# Patient Record
Sex: Male | Born: 1995
Health system: Southern US, Community
[De-identification: ages and names within clinical notes are randomized; demographics above are authoritative.]

## PROBLEM LIST (undated history)

## (undated) DIAGNOSIS — F419 Anxiety disorder, unspecified: Secondary | ICD-10-CM

## (undated) DIAGNOSIS — T7840XA Allergy, unspecified, initial encounter: Secondary | ICD-10-CM

## (undated) DIAGNOSIS — J45909 Unspecified asthma, uncomplicated: Secondary | ICD-10-CM

## (undated) HISTORY — DX: Allergy, unspecified, initial encounter: T78.40XA

## (undated) HISTORY — PX: APPENDECTOMY: SHX54

## (undated) HISTORY — PX: HERNIA REPAIR: SHX51

## (undated) HISTORY — DX: Anxiety disorder, unspecified: F41.9

---
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
## 2015-09-27 ENCOUNTER — Emergency Department
Admission: EM | Admit: 2015-09-27 | Discharge: 2015-09-27 | Disposition: A | Discharge status: Home / Self Care | Payer: Worker's Compensation | Attending: Emergency Medicine | Admitting: Emergency Medicine

## 2015-09-27 ENCOUNTER — Emergency Department: Payer: Worker's Compensation

## 2015-09-27 ENCOUNTER — Encounter: Payer: Self-pay | Admitting: *Deleted

## 2015-09-27 DIAGNOSIS — W231XXA Caught, crushed, jammed, or pinched between stationary objects, initial encounter: Secondary | ICD-10-CM | POA: Diagnosis not present

## 2015-09-27 DIAGNOSIS — Y9389 Activity, other specified: Secondary | ICD-10-CM | POA: Diagnosis not present

## 2015-09-27 DIAGNOSIS — S8001XA Contusion of right knee, initial encounter: Secondary | ICD-10-CM | POA: Insufficient documentation

## 2015-09-27 DIAGNOSIS — S99911A Unspecified injury of right ankle, initial encounter: Secondary | ICD-10-CM | POA: Diagnosis present

## 2015-09-27 DIAGNOSIS — Y9289 Other specified places as the place of occurrence of the external cause: Secondary | ICD-10-CM | POA: Diagnosis not present

## 2015-09-27 DIAGNOSIS — Y990 Civilian activity done for income or pay: Secondary | ICD-10-CM | POA: Insufficient documentation

## 2015-09-27 DIAGNOSIS — Z79899 Other long term (current) drug therapy: Secondary | ICD-10-CM | POA: Insufficient documentation

## 2015-09-27 DIAGNOSIS — S93401A Sprain of unspecified ligament of right ankle, initial encounter: Secondary | ICD-10-CM | POA: Insufficient documentation

## 2015-09-27 NOTE — Discharge Instructions (Signed)
Ankle Sprain °An ankle sprain is an injury to the strong, fibrous tissues (ligaments) that hold the bones of your ankle joint together.  °CAUSES °An ankle sprain is usually caused by a fall or by twisting your ankle. Ankle sprains most commonly occur when you step on the outer edge of your foot, and your ankle turns inward. People who participate in sports are more prone to these types of injuries.  °SYMPTOMS  °· Pain in your ankle. The pain may be present at rest or only when you are trying to stand or walk. °· Swelling. °· Bruising. Bruising may develop immediately or within 1 to 2 days after your injury. °· Difficulty standing or walking, particularly when turning corners or changing directions. °DIAGNOSIS  °Your caregiver will ask you details about your injury and perform a physical exam of your ankle to determine if you have an ankle sprain. During the physical exam, your caregiver will press on and apply pressure to specific areas of your foot and ankle. Your caregiver will try to move your ankle in certain ways. An X-ray exam may be done to be sure a bone was not broken or a ligament did not separate from one of the bones in your ankle (avulsion fracture).  °TREATMENT  °Certain types of braces can help stabilize your ankle. Your caregiver can make a recommendation for this. Your caregiver may recommend the use of medicine for pain. If your sprain is severe, your caregiver may refer you to a surgeon who helps to restore function to parts of your skeletal system (orthopedist) or a physical therapist. °HOME CARE INSTRUCTIONS  °· Apply ice to your injury for 1-2 days or as directed by your caregiver. Applying ice helps to reduce inflammation and pain. °¨ Put ice in a plastic bag. °¨ Place a towel between your skin and the bag. °¨ Leave the ice on for 15-20 minutes at a time, every 2 hours while you are awake. °· Only take over-the-counter or prescription medicines for pain, discomfort, or fever as directed by  your caregiver. °· Elevate your injured ankle above the level of your heart as much as possible for 2-3 days. °· If your caregiver recommends crutches, use them as instructed. Gradually put weight on the affected ankle. Continue to use crutches or a cane until you can walk without feeling pain in your ankle. °· If you have a plaster splint, wear the splint as directed by your caregiver. Do not rest it on anything harder than a pillow for the first 24 hours. Do not put weight on it. Do not get it wet. You may take it off to take a shower or bath. °· You may have been given an elastic bandage to wear around your ankle to provide support. If the elastic bandage is too tight (you have numbness or tingling in your foot or your foot becomes cold and blue), adjust the bandage to make it comfortable. °· If you have an air splint, you may blow more air into it or let air out to make it more comfortable. You may take your splint off at night and before taking a shower or bath. Wiggle your toes in the splint several times per day to decrease swelling. °SEEK MEDICAL CARE IF:  °· You have rapidly increasing bruising or swelling. °· Your toes feel extremely cold or you lose feeling in your foot. °· Your pain is not relieved with medicine. °SEEK IMMEDIATE MEDICAL CARE IF: °· Your toes are numb or blue. °·   You have severe pain that is increasing. MAKE SURE YOU:   Understand these instructions.  Will watch your condition.  Will get help right away if you are not doing well or get worse.   This information is not intended to replace advice given to you by your health care provider. Make sure you discuss any questions you have with your health care provider.   Document Released: 10/11/2005 Document Revised: 11/01/2014 Document Reviewed: 10/23/2011 Elsevier Interactive Patient Education [employer].  Please return immediately if condition worsens. Please contact her primary physician or the physician you were given  for referral. If you have any specialist physicians involved in her treatment and plan please also contact them. Thank you for using [facility].

## 2015-09-27 NOTE — ED Notes (Signed)
Called primary contact listed on [facility] profile, message left regarding pt injury. UDS, Breath analysis, and BAC are all upon request. No one answered contact phone listed as primary and we were not advised if testing was indicated at this time.

## 2015-09-27 NOTE — ED Notes (Signed)
Pt states foot was caught in product wrapping on a palette. Pt states he fell on R knee and twisted R ankle. Pt presents using crutch ambulation, c/o painful weight bearing on R leg.

## 2015-09-28 NOTE — ED Provider Notes (Signed)
Time Seen: Approximately 0 300  I have reviewed the triage notes  Chief Complaint: Foot Injury   History of Present Illness: Philip Stephenson is a 19 y.o. male who was at work tonight when he slipped and twisted his right ankle and struck his right knee. Patient states he was caught on a pallet and then fell to his right knee and twisted his ankle. Patient denies any head trauma and has been able to bear some weight on his right foot..   Past Medical History  Diagnosis Date  . Arthritis     There are no active problems to display for this patient.   Past Surgical History  Procedure Laterality Date  . Appendectomy    . Hernia repair      Past Surgical History  Procedure Laterality Date  . Appendectomy    . Hernia repair      Current Outpatient Rx  Name  Route  Sig  Dispense  Refill  . albuterol (PROVENTIL HFA;VENTOLIN HFA) 108 (90 BASE) MCG/ACT inhaler   Inhalation   Inhale into the lungs every 6 (six) hours as needed for wheezing or shortness of breath.         . cetirizine (ZYRTEC) 10 MG tablet   Oral   Take 10 mg by mouth daily.         . montelukast (SINGULAIR) 10 MG tablet   Oral   Take 10 mg by mouth at bedtime.           Allergies:  Review of patient's allergies indicates no known allergies.  Family History: History reviewed. No pertinent family history.  Social History: Social History  Substance Use Topics  . Smoking status: Never Smoker   . Smokeless tobacco: Never Used  . Alcohol Use: Yes     Comment: rarely     Review of Systems:   Patient denies any head trauma he denies any other extremity discomfort other than the right knee and his right ankle. He denies any previous injury. Physical Exam:  ED Triage Vitals  Enc Vitals Group     BP 09/27/15 0207 143/72 mmHg     Pulse Rate 09/27/15 0207 111     Resp 09/27/15 0207 18     Temp 09/27/15 0207 98.2 F (36.8 C)     Temp Source 09/27/15 0207 Oral     SpO2 09/27/15 0207 100 %      Weight 09/27/15 0207 232 lb (105.235 kg)     Height 09/27/15 0207 5\' 7"  (1.702 m)     Head Cir --      Peak Flow --      Pain Score 09/27/15 0208 7     Pain Loc --      Pain Edu? --      Excl. in [place]? --     General: Awake , Alert , and Oriented times 3; GCS 15 Head: Normal cephalic , atraumatic Lungs: Clear to ascultation without wheezes , rhonchi, or rales Heart: Regular rate, regular rhythm without murmurs , gallops , or rubs    Extremities: Patient's knee is stable to palpation with no external contusions, abrasion or obvious joint effusion. Negative Lockman. Negative anterior posterior drawer test. The right ankle appears to have ecchymosis on the lateral surface of the ankle is tender the Achilles tendon is intact Neurologic: normal ambulation, Motor symmetric without deficits, sensory intact Skin: warm, dry, no rashes    Radiology: *   CLINICAL DATA: Pain after twisting injury.  EXAM: RIGHT ANKLE - COMPLETE 3+ VIEW  COMPARISON: None.  FINDINGS: There is no evidence of fracture, dislocation, or joint effusion. There is no evidence of arthropathy or other focal bone abnormality. Soft tissues are unremarkable.  IMPRESSION: Negative.   Electronically Signed By: Andreas Newport M.D. On: 09/27/2015 03:07          DG Knee Complete 4 Views Right (Final result) Result time: 09/27/15 [id number]   Final result by Rad Results In Interface (09/27/15 [id number])   Narrative:   CLINICAL DATA: Pain after fall and twisting injury  EXAM: RIGHT KNEE - COMPLETE 4+ VIEW  COMPARISON: None.  FINDINGS: There is no evidence of fracture, dislocation, or joint effusion. There is no evidence of arthropathy or other focal bone abnormality. Soft tissues are unremarkable.  IMPRESSION: Negative.       I personally reviewed the radiologic studies   Procedures:  We attempted to establish a right ankle brace but recurrently out of the Velcro braces so we  placed a Ace wrap and the patient may be able to acquire an ankle brace with his orthopedic follow-up    ED Course:  Patient's stay here was uneventful and he or he has apparent crutches. I felt his knee would be fine as likely only a mild contusion. He appears to have a grade 1 ankle sprain. He does not have any tenderness over his foot and his x-rays did not show any other fractures or lesions.   Assessment:  Right ankle sprain Right knee contusion  Final Clinical Impression:  Final diagnoses:  Right ankle sprain, initial encounter     Plan:  Outpatient management Patient was advised to return immediately if condition worsens. Patient was advised to follow up with her primary care physician or other specialized physicians involved in their outpatient care             Daymon Larsen, MD 09/28/15 0157

## 2016-08-30 ENCOUNTER — Emergency Department
Admission: EM | Admit: 2016-08-30 | Discharge: 2016-08-30 | Disposition: A | Discharge status: Home / Self Care | Payer: Worker's Compensation | Attending: Emergency Medicine | Admitting: Emergency Medicine

## 2016-08-30 DIAGNOSIS — J45909 Unspecified asthma, uncomplicated: Secondary | ICD-10-CM | POA: Diagnosis not present

## 2016-08-30 DIAGNOSIS — Y990 Civilian activity done for income or pay: Secondary | ICD-10-CM | POA: Insufficient documentation

## 2016-08-30 DIAGNOSIS — Y929 Unspecified place or not applicable: Secondary | ICD-10-CM | POA: Insufficient documentation

## 2016-08-30 DIAGNOSIS — Z23 Encounter for immunization: Secondary | ICD-10-CM | POA: Diagnosis not present

## 2016-08-30 DIAGNOSIS — W268XXA Contact with other sharp object(s), not elsewhere classified, initial encounter: Secondary | ICD-10-CM | POA: Diagnosis not present

## 2016-08-30 DIAGNOSIS — Y9389 Activity, other specified: Secondary | ICD-10-CM | POA: Diagnosis not present

## 2016-08-30 DIAGNOSIS — Z79899 Other long term (current) drug therapy: Secondary | ICD-10-CM | POA: Insufficient documentation

## 2016-08-30 DIAGNOSIS — S61012A Laceration without foreign body of left thumb without damage to nail, initial encounter: Secondary | ICD-10-CM | POA: Diagnosis not present

## 2016-08-30 HISTORY — DX: Unspecified asthma, uncomplicated: J45.909

## 2016-08-30 MED ORDER — TETANUS-DIPHTH-ACELL PERTUSSIS 5-2.5-18.5 LF-MCG/0.5 IM SUSP
0.5000 mL | Freq: Once | INTRAMUSCULAR | Status: AC
  Administered 2016-08-30: 0.5 mL via INTRAMUSCULAR
  Filled 2016-08-30: qty 0.5

## 2016-08-30 NOTE — ED Notes (Addendum)
Pt was at work and cut left thumb - pt reports he dropped a piece of sheet metal and it bounced up and lacerated thumb - bleed is controlled at this time - pt did drug screen at work for this workers comp claim

## 2016-08-30 NOTE — ED Provider Notes (Signed)
[facility] Emergency Department Provider Note  ____________________________________________  Time seen: Approximately 10:24 PM  I have reviewed the triage vital signs and the nursing notes.   HISTORY  Chief Complaint Laceration    HPI Philip Stephenson is a 20 y.o. male who presents emergency department for laceration to the left thumb. Patient states this occurred at work. He states that he was working with a piece of sheet metal when he dropped it and it bounced up and caught his thumb. Patient denies any pain at this time. He states when he was easily controlled. He denies any loss of range of motion to his thumb. Patient is unsure of his last tetanus shot. No other injury or complaint. No medications prior to arrival.   Past Medical History:  Diagnosis Date  . Asthma     There are no active problems to display for this patient.   Past Surgical History:  Procedure Laterality Date  . APPENDECTOMY    . HERNIA REPAIR      Prior to Admission medications   Medication Sig Start Date End Date Taking? Authorizing Provider  albuterol (PROVENTIL HFA;VENTOLIN HFA) 108 (90 BASE) MCG/ACT inhaler Inhale into the lungs every 6 (six) hours as needed for wheezing or shortness of breath.    Historical Provider, MD  cetirizine (ZYRTEC) 10 MG tablet Take 10 mg by mouth daily.    Historical Provider, MD  montelukast (SINGULAIR) 10 MG tablet Take 10 mg by mouth at bedtime.    Historical Provider, MD    Allergies Patient has no known allergies.  No family history on file.  Social History Social History  Substance Use Topics  . Smoking status: Never Smoker  . Smokeless tobacco: Never Used  . Alcohol use Yes     Comment: rarely     Review of Systems  Constitutional: No fever/chills Cardiovascular: no chest pain. Respiratory: no cough. No SOB. Gastrointestinal: No abdominal pain.  No nausea, no vomiting.  Musculoskeletal: Negative for musculoskeletal  pain. Skin: Positive for laceration to the left thumb Neurological: Negative for headaches, focal weakness or numbness. 10-point ROS otherwise negative.  ____________________________________________   PHYSICAL EXAM:  VITAL SIGNS: ED Triage Vitals  Enc Vitals Group     BP 08/30/16 2147 (!) 159/64     Pulse Rate 08/30/16 2147 94     Resp 08/30/16 2147 18     Temp 08/30/16 2147 98.6 F (37 C)     Temp Source 08/30/16 2147 Oral     SpO2 08/30/16 2147 99 %     Weight 08/30/16 2147 215 lb (97.5 kg)     Height 08/30/16 2147 5' 7.5" (1.715 m)     Head Circumference --      Peak Flow --      Pain Score 08/30/16 2155 5     Pain Loc --      Pain Edu? --      Excl. in [place]? --      Constitutional: Alert and oriented. Well appearing and in no acute distress. Eyes: Conjunctivae are normal. PERRL. EOMI. Head: Atraumatic. Cardiovascular: Normal rate, regular rhythm. Normal S1 and S2.  Good peripheral circulation. Respiratory: Normal respiratory effort without tachypnea or retractions. Lungs CTAB. Good air entry to the bases with no decreased or absent breath sounds. Musculoskeletal: Full range of motion to all extremities. No gross deformities appreciated. Neurologic:  Normal speech and language. No gross focal neurologic deficits are appreciated.  Skin:  Skin is warm, dry and intact.  No rash noted.2 small superficial lacerations noted to the dorsal aspect of the thumb. No bleeding. No foreign body. Edges are smooth and nature. Lacerations are superficial. Both lacerations are less than 1 cm in length. Sensation Refill intact distally. Psychiatric: Mood and affect are normal. Speech and behavior are normal. Patient exhibits appropriate insight and judgement.   ____________________________________________   LABS (all labs ordered are listed, but only abnormal results are displayed)  Labs Reviewed - No data to  display ____________________________________________  EKG   ____________________________________________  RADIOLOGY   No results found.  ____________________________________________    PROCEDURES  Procedure(s) performed:    Marland KitchenMarland KitchenLaceration Repair Date/Time: 08/31/2016 12:14 AM Performed by: Betha Loa D Authorized by: Betha Loa D   Consent:    Consent obtained:  Verbal   Consent given by:  Patient   Risks discussed:  Infection   Alternatives discussed:  No treatment Anesthesia (see MAR for exact dosages):    Anesthesia method:  None Laceration details:    Location:  Finger   Finger location:  L thumb   Length (cm):  1 Repair type:    Repair type:  Simple Exploration:    Wound exploration: entire depth of wound probed and visualized     Wound extent: no foreign bodies/material noted, no muscle damage noted, no nerve damage noted, no tendon damage noted and no underlying fracture noted     Contaminated: no   Treatment:    Area cleansed with:  Shur-Clens   Amount of cleaning:  Standard   Irrigation solution:  Sterile saline   Irrigation method:  Syringe Skin repair:    Repair method:  Tissue adhesive Approximation:    Approximation:  Close Post-procedure details:    Dressing:  Open (no dressing)   Patient tolerance of procedure:  Tolerated well, no immediate complications      Medications  Tdap (BOOSTRIX) injection 0.5 mL (0.5 mLs Intramuscular Given 08/30/16 2305)     ____________________________________________   INITIAL IMPRESSION / ASSESSMENT AND PLAN / ED COURSE  Pertinent labs & imaging results that were available during my care of the patient were reviewed by me and considered in my medical decision making (see chart for details).  Review of the [place] CSRS was performed in accordance of the [place] prior to dispensing any controlled drugs.  Clinical Course     Patient's diagnosis is consistent with 2 superficial lacerations to  the left thumb. These are covered with Dermabond. Patient is given tdap at this time. No medications are prescribed.. Patient will follow-up with primary care as needed. Patient is given ED precautions to return to the ED for any worsening or new symptoms.     ____________________________________________  FINAL CLINICAL IMPRESSION(S) / ED DIAGNOSES  Final diagnoses:  Laceration of left thumb without foreign body without damage to nail, initial encounter      NEW MEDICATIONS STARTED DURING THIS VISIT:  Discharge Medication List as of 08/30/2016 10:53 PM          This chart was dictated using voice recognition software/Dragon. Despite best efforts to proofread, errors can occur which can change the meaning. Any change was purely unintentional.    Darletta Moll, PA-C 08/31/16 0016    Lisa Roca, MD 09/02/16 1026

## 2016-08-30 NOTE — ED Notes (Signed)
Patient stated he had drug screen testing completed at [place] prior to them filling the paperwork out. Patient presented EDT with verification of drug screen paperwork

## 2016-08-30 NOTE — ED Triage Notes (Signed)
Pt presents to ED with c/o LEFT thumb laceration that happened today at work. Pt reports injury happened around 820 pm when he cut himself with some sheet metal. Pt wished to file [facility], pt presents with paperwork from work where UDS was completed PTA. Pt presents with laceration covered with a band-aid with no bleeding at this time.

## 2017-02-08 DIAGNOSIS — L501 Idiopathic urticaria: Secondary | ICD-10-CM | POA: Diagnosis not present

## 2017-02-08 DIAGNOSIS — J4530 Mild persistent asthma, uncomplicated: Secondary | ICD-10-CM | POA: Diagnosis not present

## 2017-02-08 DIAGNOSIS — J301 Allergic rhinitis due to pollen: Secondary | ICD-10-CM | POA: Diagnosis not present

## 2017-03-24 ENCOUNTER — Ambulatory Visit: Payer: Self-pay | Admitting: Nurse Practitioner

## 2017-03-31 DIAGNOSIS — L219 Seborrheic dermatitis, unspecified: Secondary | ICD-10-CM | POA: Diagnosis not present

## 2017-04-05 ENCOUNTER — Ambulatory Visit (INDEPENDENT_AMBULATORY_CARE_PROVIDER_SITE_OTHER): Payer: Self-pay | Admitting: Nurse Practitioner

## 2017-04-05 ENCOUNTER — Encounter: Payer: Self-pay | Admitting: Nurse Practitioner

## 2017-04-05 VITALS — BP 113/60 | HR 59 | Temp 97.9°F | Ht 67.5 in | Wt 212.8 lb

## 2017-04-05 DIAGNOSIS — Z7689 Persons encountering health services in other specified circumstances: Secondary | ICD-10-CM | POA: Diagnosis not present

## 2017-04-05 DIAGNOSIS — Z0000 Encounter for general adult medical examination without abnormal findings: Secondary | ICD-10-CM

## 2017-04-05 DIAGNOSIS — E6609 Other obesity due to excess calories: Secondary | ICD-10-CM | POA: Insufficient documentation

## 2017-04-05 DIAGNOSIS — Z114 Encounter for screening for human immunodeficiency virus [HIV]: Secondary | ICD-10-CM

## 2017-04-05 DIAGNOSIS — Z6835 Body mass index (BMI) 35.0-35.9, adult: Secondary | ICD-10-CM

## 2017-04-05 NOTE — Patient Instructions (Addendum)
Mostyn, Thank you for coming in to clinic today.  1. You will be due for FASTING BLOOD WORK (no food or drink after midnight before, only water or coffee without cream/sugar on the morning of)  - Get drawn at any [facility] location.  They will come to me automatically.  For Lab Results, once available within 2-3 days of blood draw, you can can log in to MyChart online to view your results and a brief explanation. Also, we can discuss results at next follow-up visit.  2. Continue working on your weight loss! Great work!  Please schedule a follow-up appointment with Cassell Smiles, AGNP to Return in about 1 year (around 04/05/2018) for annual physical.  If you have any other questions or concerns, please feel free to call the clinic or send a message through [facility]. You may also schedule an earlier appointment if necessary.  Cassell Smiles, DNP, AGNP-BC Adult Gerontology Nurse Practitioner [place]

## 2017-04-05 NOTE — Progress Notes (Signed)
I have reviewed this encounter including the documentation in this note and/or discussed this patient with the provider, Cassell Smiles, AGPCNP-BC. I am certifying that I agree with the content of this note as supervising physician.  Nobie Putnam, [facility] 04/05/2017, 12:28 PM

## 2017-04-05 NOTE — Progress Notes (Signed)
Subjective:    Patient ID: Philip Stephenson, male    DOB: Jul 30, 1996, 21 y.o.   MRN: [id number]  Philip Stephenson is a 21 y.o. male presenting on 04/05/2017 for [place] Provider and Annual Physical Pt last seen by PCP in April 2015 years ago.  Obtain records from Care everywhere for [facility].   Patient has been feeling well.  They have no acute concerns today. He has an allergist for asthma and is in good control.  Uses albuterol 1-2 x per month.  Sleeps 7-10 hours per night uninterrupted.  Health Maintenance Weight/BMI: High at 32.8.  On weight watchers for last 2 months; down 90 lbs in 1.5 years. 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  Walking outside of work about 2 d/ week. Diet: Weight Watchers Seatbelt: Always Sunscreen: w/ longer exposures Tetanus: 2017, but no record in [facility] or Care Everywhere Gardasil: Series of 2 given 02/20/2014 and 04/22/2014 w/o follow-up for 3rd dose.   Past Medical History:  Diagnosis Date  . Allergy   . Asthma    Past Surgical History:  Procedure Laterality Date  . APPENDECTOMY    . HERNIA REPAIR     Social History   Social History  . Marital status: Single    Spouse name: N/A  . Number of children: N/A  . Years of education: N/A   Occupational History  . Not on file.   Social History Main Topics  . Smoking status: Never Smoker  . Smokeless tobacco: Never Used  . Alcohol use Yes     Comment: rarely  . Drug use: No  . Sexual activity: No   Other Topics Concern  . Not on file   Social History Narrative  . No narrative on file   Family History  Problem Relation Age of Onset  . Basal cell carcinoma Mother   . Depression Mother   . Cancer Mother        basal cell carcinoma nose  . Diabetes Father   . Thyroid disease Father   . Atrial fibrillation Father   . Heart failure Father   . Heart disease Father   . Depression Sister   . Diabetes  Maternal Grandfather   . Stroke Maternal Grandmother   . Prostate cancer Paternal Grandfather   . Diabetes Paternal Grandfather   . Depression Sister    Current Outpatient Prescriptions on File Prior to Visit  Medication Sig  . cetirizine (ZYRTEC) 10 MG tablet Take 10 mg by mouth daily.  . montelukast (SINGULAIR) 10 MG tablet Take 10 mg by mouth at bedtime.  Marland Kitchen albuterol (PROVENTIL HFA;VENTOLIN HFA) 108 (90 BASE) MCG/ACT inhaler Inhale into the lungs every 6 (six) hours as needed for wheezing or shortness of breath.   No current facility-administered medications on file prior to visit.     Review of Systems  Constitutional: Negative.   HENT: Negative.   Eyes: Negative.   Respiratory: Negative.   Cardiovascular: Negative.   Gastrointestinal: Negative.   Endocrine: Negative.   Genitourinary: Negative.   Musculoskeletal: Negative.   Skin: Negative.   Allergic/Immunologic: Negative.   Neurological: Negative.   Hematological: Negative.   Psychiatric/Behavioral: Negative.    Per HPI unless specifically indicated above      Objective:    BP 113/60 (BP Location: Right Arm, Patient Position: Sitting, Cuff Size: Large)   Pulse (!) 59   Temp 97.9 F (36.6 C) (  Oral)   Ht 5' 7.5" (1.715 m)   Wt 212 lb 12.8 oz (96.5 kg)   SpO2 100%   BMI 32.84 kg/m    Wt Readings from Last 3 Encounters:  04/05/17 212 lb 12.8 oz (96.5 kg)  08/30/16 215 lb (97.5 kg)  09/27/15 232 lb (105.2 kg)    Physical Exam  General - healthy, well-appearing, NAD HEENT - Normocephalic, atraumatic, PERRL, EOMI, patent nares w/o congestion, oropharynx clear, MMM Neck - supple, non-tender, no LAD, no thyromegaly Heart - RRR, bradycardia, no murmurs heard Lungs - Clear throughout all lobes, no wheezing, crackles, or rhonchi. Normal work of breathing. Abdomen - soft, NTND, no masses, no hepatosplenomegaly, active bowel sounds GU - pt deferred Extremeties - non-tender, no edema, cap refill < 2 seconds,  peripheral pulses intact +2 bilaterally Skin - warm, dry, no rashes Neuro - awake, alert, oriented, CN II-X intact, intact muscle strength 5/5 bilaterally, intact distal sensation to light touch, normal coordination, normal gait Psych - Normal mood and affect, normal behavior   No results found for this or any previous visit.    Assessment & Plan:   Problem List Items Addressed This Visit      Other   Class 2 obesity due to excess calories with body mass index (BMI) of 35.0 to 35.9 in adult    Pt with obesity and improving status w/ 90 lb weight loss in 1.5 years.  Currently on Weight Watchers x 2 months.  Physcial activity regularly w/ job and on days off.  Plan: 1. Continue weight loss to pt stated goal of 190 lbs. 2. Once achieved, focus on maintenance to prevent regain. 3. Screening labs for lipid disorders, TSH for possible medical cause of obesity, CMP eval liver function and rule out metabolic syndrome/fatty liver disease. Follow up 1 year at annual physical.       Relevant Orders   Comprehensive metabolic panel   Lipid panel   TSH    Other Visit Diagnoses    Annual physical exam    -  Primary Physical exam with no new findings.  Well adult with no acute concerns.  Plan: 1. Obtain health maintenance screenings.  Labs via [facility] to include screenings for obesity and general wellness. 2. Pt w/ only 2 injections of HPV vaccine, not 3.  Now > 8 years of age and in low sexual risk behaviors. Do not complete catchup vaccine per CDC guidelines.  3. Return 1 year for annual physical.   Relevant Orders   Comprehensive metabolic panel   Lipid panel   HIV antibody   TSH   Screening for HIV without presence of risk factors     Pt w/ mutually monogamous relationship w/ male at low risk w/o previous screening for any STDs.  Prior sexual partner.   Relevant Orders   HIV antibody   Encounter to establish care     First PCP since pediatrics.  Records in [place] from [place].   Continue relationship w/ pulm/allergist Dr. Salvadore Dom for asthma/allergies.      No orders of the defined types were placed in this encounter.    Follow up plan: Return in about 1 year (around 04/05/2018) for annual physical.  Cassell Smiles, DNP, AGPCNP-BC Adult Gerontology Primary Care Nurse Practitioner [facility] 04/05/2017, 10:32 AM

## 2017-04-05 NOTE — Assessment & Plan Note (Signed)
Pt with obesity and improving status w/ 90 lb weight loss in 1.5 years.  Currently on Weight Watchers x 2 months.  Physcial activity regularly w/ job and on days off.  Plan: 1. Continue weight loss to pt stated goal of 190 lbs. 2. Once achieved, focus on maintenance to prevent regain. 3. Follow up 1 year at annual physical.

## 2017-04-12 DIAGNOSIS — Z0000 Encounter for general adult medical examination without abnormal findings: Secondary | ICD-10-CM | POA: Diagnosis not present

## 2017-04-13 ENCOUNTER — Other Ambulatory Visit: Payer: Self-pay | Admitting: Nurse Practitioner

## 2017-04-13 DIAGNOSIS — E039 Hypothyroidism, unspecified: Secondary | ICD-10-CM

## 2017-04-13 LAB — COMPREHENSIVE METABOLIC PANEL
ALT: 25 IU/L (ref 0–44)
AST: 22 IU/L (ref 0–40)
Albumin/Globulin Ratio: 1.5 (ref 1.2–2.2)
Albumin: 4.4 g/dL (ref 3.5–5.5)
Alkaline Phosphatase: 74 IU/L (ref 39–117)
BUN/Creatinine Ratio: 10 (ref 9–20)
BUN: 9 mg/dL (ref 6–20)
Bilirubin Total: 1.6 mg/dL — ABNORMAL HIGH (ref 0.0–1.2)
CO2: 24 mmol/L (ref 20–29)
Calcium: 9.6 mg/dL (ref 8.7–10.2)
Chloride: 99 mmol/L (ref 96–106)
Creatinine, Ser: 0.92 mg/dL (ref 0.76–1.27)
GFR calc Af Amer: 136 mL/min/{1.73_m2} (ref >59)
GFR calc non Af Amer: 118 mL/min/{1.73_m2} (ref >59)
Globulin, Total: 3 g/dL (ref 1.5–4.5)
Glucose: 81 mg/dL (ref 65–99)
Potassium: 4.2 mmol/L (ref 3.5–5.2)
Sodium: 140 mmol/L (ref 134–144)
Total Protein: 7.4 g/dL (ref 6.0–8.5)

## 2017-04-13 LAB — LIPID PANEL
Chol/HDL Ratio: 3 ratio (ref 0.0–5.0)
Cholesterol, Total: 127 mg/dL (ref 100–199)
HDL: 42 mg/dL (ref >39)
LDL Calculated: 62 mg/dL (ref 0–99)
Triglycerides: 115 mg/dL (ref 0–149)
VLDL Cholesterol Cal: 23 mg/dL (ref 5–40)

## 2017-04-13 LAB — HIV ANTIBODY (ROUTINE TESTING W REFLEX): HIV Screen 4th Generation wRfx: NONREACTIVE

## 2017-04-13 LAB — TSH: TSH: 6.14 u[IU]/mL — ABNORMAL HIGH (ref 0.450–4.500)

## 2017-04-13 MED ORDER — LEVOTHYROXINE SODIUM 50 MCG PO TABS: 50.0000 ug | ORAL_TABLET | Freq: Every day | ORAL | 2 refills | Status: DC

## 2017-04-14 ENCOUNTER — Encounter: Payer: Self-pay | Admitting: Nurse Practitioner

## 2017-04-14 NOTE — Progress Notes (Signed)
No vm set up. 

## 2017-04-15 ENCOUNTER — Encounter: Payer: Self-pay | Admitting: Nurse Practitioner

## 2017-04-15 NOTE — Progress Notes (Signed)
I replied to the pt in a mychart message.

## 2017-04-28 ENCOUNTER — Ambulatory Visit
Admission: RE | Admit: 2017-04-28 | Discharge: 2017-04-28 | Disposition: A | Discharge status: Home / Self Care | Payer: 59 | Source: Ambulatory Visit | Attending: Nurse Practitioner | Admitting: Nurse Practitioner

## 2017-04-28 DIAGNOSIS — E039 Hypothyroidism, unspecified: Secondary | ICD-10-CM | POA: Diagnosis not present

## 2017-04-28 DIAGNOSIS — Z8639 Personal history of other endocrine, nutritional and metabolic disease: Secondary | ICD-10-CM | POA: Diagnosis not present

## 2017-05-25 ENCOUNTER — Encounter: Payer: Self-pay | Admitting: Emergency Medicine

## 2017-05-25 DIAGNOSIS — J45909 Unspecified asthma, uncomplicated: Secondary | ICD-10-CM | POA: Diagnosis not present

## 2017-05-25 DIAGNOSIS — R079 Chest pain, unspecified: Secondary | ICD-10-CM | POA: Insufficient documentation

## 2017-05-25 DIAGNOSIS — R0602 Shortness of breath: Secondary | ICD-10-CM | POA: Diagnosis not present

## 2017-05-25 DIAGNOSIS — R0789 Other chest pain: Secondary | ICD-10-CM | POA: Diagnosis not present

## 2017-05-25 DIAGNOSIS — Z79899 Other long term (current) drug therapy: Secondary | ICD-10-CM | POA: Insufficient documentation

## 2017-05-25 NOTE — ED Triage Notes (Signed)
Patient ambulatory to triage with steady gait, without difficulty or distress noted; pt reports having left sided CP radiating into left side of neck x 2 hours accomp by [facility] and light-headedness; denies hx of same

## 2017-05-26 ENCOUNTER — Emergency Department
Admission: EM | Admit: 2017-05-26 | Discharge: 2017-05-26 | Disposition: A | Discharge status: Home / Self Care | Payer: 59 | Attending: Student in an Organized Health Care Education/Training Program | Admitting: Student in an Organized Health Care Education/Training Program

## 2017-05-26 ENCOUNTER — Emergency Department: Payer: 59

## 2017-05-26 DIAGNOSIS — R0602 Shortness of breath: Secondary | ICD-10-CM | POA: Diagnosis not present

## 2017-05-26 DIAGNOSIS — R079 Chest pain, unspecified: Secondary | ICD-10-CM

## 2017-05-26 LAB — BASIC METABOLIC PANEL
Anion gap: 8 (ref 5–15)
BUN: 13 mg/dL (ref 6–20)
CHLORIDE: 105 mmol/L (ref 101–111)
CO2: 28 mmol/L (ref 22–32)
CREATININE: 0.89 mg/dL (ref 0.61–1.24)
Calcium: 9.4 mg/dL (ref 8.9–10.3)
GFR calc Af Amer: >60 mL/min (ref >60)
GFR calc non Af Amer: >60 mL/min (ref >60)
Glucose, Bld: 86 mg/dL (ref 65–99)
Potassium: 4 mmol/L (ref 3.5–5.1)
SODIUM: 141 mmol/L (ref 135–145)

## 2017-05-26 LAB — CBC
HCT: 42.8 % (ref 40.0–52.0)
Hemoglobin: 14.4 g/dL (ref 13.0–18.0)
MCH: 29.8 pg (ref 26.0–34.0)
MCHC: 33.5 g/dL (ref 32.0–36.0)
MCV: 88.8 fL (ref 80.0–100.0)
PLATELETS: 246 10*3/uL (ref 150–440)
RBC: 4.82 MIL/uL (ref 4.40–5.90)
RDW: 13.6 % (ref 11.5–14.5)
WBC: 10.5 10*3/uL (ref 3.8–10.6)

## 2017-05-26 LAB — FIBRIN DERIVATIVES D-DIMER (ARMC ONLY): Fibrin derivatives D-dimer (ARMC): 187.57 (ref 0.00–499.00)

## 2017-05-26 LAB — TROPONIN I: Troponin I: <0.03 ng/mL (ref <0.03)

## 2017-05-26 MED ORDER — TRAMADOL HCL 50 MG PO TABS: 50.0000 mg | ORAL_TABLET | Freq: Four times a day (QID) | ORAL | 0 refills | Status: DC | PRN

## 2017-05-26 MED ORDER — KETOROLAC TROMETHAMINE 30 MG/ML IJ SOLN
15.0000 mg | Freq: Once | INTRAMUSCULAR | Status: AC
  Administered 2017-05-26: 15 mg via INTRAVENOUS
  Filled 2017-05-26: qty 1

## 2017-05-26 NOTE — ED Provider Notes (Signed)
[facility] Emergency Department Provider Note    First MD Initiated Contact with Patient 05/26/17 0216     (approximate)  I have reviewed the triage vital signs and the nursing notes.   HISTORY  Chief Complaint Chest Pain    HPI Philip Stephenson is a 21 y.o. male history of asthma presents with sudden onset left-sided chest pain that started around9:30 PM while the patient was at work. States he was working at [employer] Not lifting any heavy objects out of the ordinary. States this does not feel like a pulled muscle. States it is nonradiating pain it does hurt with taking a deep breath. States he does have some discomfort up into his left neck. No associated numbness or tingling. Does not feel short of breath. States it does not feel like asthma exacerbation. Has not had any cough. No recent fevers. Does not have any pain right now. No lower extremity swelling. He does not smoke. No recent prolonged travel or surgeries.   Past Medical History:  Diagnosis Date  . Allergy   . Asthma    Family History  Problem Relation Age of Onset  . Basal cell carcinoma Mother   . Depression Mother   . Cancer Mother        basal cell carcinoma nose  . Diabetes Father   . Thyroid disease Father   . Atrial fibrillation Father   . Heart failure Father   . Heart disease Father   . Depression Sister   . Prostate cancer Paternal Grandfather   . Diabetes Paternal Grandfather   . Depression Sister    Past Surgical History:  Procedure Laterality Date  . APPENDECTOMY    . HERNIA REPAIR     Patient Active Problem List   Diagnosis Date Noted  . Class 2 obesity due to excess calories with body mass index (BMI) of 35.0 to 35.9 in adult 04/05/2017      Prior to Admission medications   Medication Sig Start Date End Date Taking? Authorizing Provider  albuterol (PROVENTIL HFA;VENTOLIN HFA) 108 (90 BASE) MCG/ACT inhaler Inhale into the lungs every 6 (six) hours as  needed for wheezing or shortness of breath.    [provider]  cetirizine (ZYRTEC) 10 MG tablet Take 10 mg by mouth daily.    [provider]  levothyroxine (SYNTHROID, LEVOTHROID) 50 MCG tablet Take 1 tablet (50 mcg total) by mouth daily before breakfast. 04/13/17   Mikey College, NP  montelukast (SINGULAIR) 10 MG tablet Take 10 mg by mouth at bedtime.    [provider]  traMADol (ULTRAM) 50 MG tablet Take 1 tablet (50 mg total) by mouth every 6 (six) hours as needed. 05/26/17 05/26/18  Merlyn Lot, MD    Allergies Patient has no known allergies.    Social History Social History  Substance Use Topics  . Smoking status: Never Smoker  . Smokeless tobacco: Never Used  . Alcohol use Yes     Comment: rarely    Review of Systems Patient denies headaches, rhinorrhea, blurry vision, numbness, shortness of breath, chest pain, edema, cough, abdominal pain, nausea, vomiting, diarrhea, dysuria, fevers, rashes or hallucinations unless otherwise stated above in HPI. ____________________________________________   PHYSICAL EXAM:  VITAL SIGNS: Vitals:   05/26/17 0300 05/26/17 0334  BP: 123/65 110/80  Pulse: 75 64  Resp: 13 13  Temp:      Constitutional: Alert and oriented. Well appearing and in no acute distress. Eyes: Conjunctivae are normal.  Head: Atraumatic. Nose: No congestion/rhinnorhea. Mouth/Throat: Mucous membranes are moist.   Neck: No stridor. Painless ROM.  Cardiovascular: Normal rate, regular rhythm. Grossly normal heart sounds.  Good peripheral circulation. Respiratory: Normal respiratory effort.  No retractions. Lungs CTAB. Gastrointestinal: Soft and nontender. No distention. No abdominal bruits. No CVA tenderness. Genitourinary:  Musculoskeletal: No lower extremity tenderness nor edema.  No joint effusions. Neurologic:  Normal speech and language. No gross focal neurologic deficits are appreciated. No facial droop Skin:  Skin is  warm, dry and intact. No rash noted. Psychiatric: Mood and affect are normal. Speech and behavior are normal.  ____________________________________________   LABS (all labs ordered are listed, but only abnormal results are displayed)  Results for orders placed or performed during the hospital encounter of 05/26/17 (from the past 24 hour(s))  Basic metabolic panel     Status: None   Collection Time: 05/26/17 12:00 AM  Result Value Ref Range   Sodium 141 135 - 145 mmol/L   Potassium 4.0 3.5 - 5.1 mmol/L   Chloride 105 101 - 111 mmol/L   CO2 28 22 - 32 mmol/L   Glucose, Bld 86 65 - 99 mg/dL   BUN 13 6 - 20 mg/dL   Creatinine, Ser 0.89 0.61 - 1.24 mg/dL   Calcium 9.4 8.9 - 10.3 mg/dL   GFR calc non Af Amer >60 >60 mL/min   GFR calc Af Amer >60 >60 mL/min   Anion gap 8 5 - 15  CBC     Status: None   Collection Time: 05/26/17 12:00 AM  Result Value Ref Range   WBC 10.5 3.8 - 10.6 K/uL   RBC 4.82 4.40 - 5.90 MIL/uL   Hemoglobin 14.4 13.0 - 18.0 g/dL   HCT 42.8 40.0 - 52.0 %   MCV 88.8 80.0 - 100.0 fL   MCH 29.8 26.0 - 34.0 pg   MCHC 33.5 32.0 - 36.0 g/dL   RDW 13.6 11.5 - 14.5 %   Platelets 246 150 - 440 K/uL  Troponin I     Status: None   Collection Time: 05/26/17 12:00 AM  Result Value Ref Range   Troponin I <0.03 <0.03 ng/mL  Fibrin derivatives D-Dimer ([facility] only)     Status: None   Collection Time: 05/26/17  2:45 AM  Result Value Ref Range   Fibrin derivatives D-dimer ([facility]) 187.57 0.00 - 499.00   ____________________________________________  EKG My review and personal interpretation at Time: 23:59   Indication: 95  Rate: chest pain  Rhythm: sinus Axis: normal  Other: no stemi, no brugada no wpw,  ____________________________________________  RADIOLOGY  I personally reviewed all radiographic images ordered to evaluate for the above acute complaints and reviewed radiology reports and findings.  These findings were personally discussed with the patient.  Please see  medical record for radiology report.  ____________________________________________   PROCEDURES  Procedure(s) performed:  Procedures    Critical Care performed: no ____________________________________________   INITIAL IMPRESSION / ASSESSMENT AND PLAN / ED COURSE  Pertinent labs & imaging results that were available during my care of the patient were reviewed by me and considered in my medical decision making (see chart for details).  DDX: ACS, pericarditis, esophagitis, boerhaaves, pe, dissection, pna, bronchitis, costochondritis   Philip Stephenson is a 4 y.o. who presents to the ED with Chest pain as described above. Patient well-appearing and in no acute distress. Blood work is reassuring. No evidence of infectious process. No evidence of pneumothorax. No cardiomegaly. EKG shows no evidence  of Brugada, WPW or acute ischemia. Less consistent with pericarditis. No clinical findings to suggest endocarditis. Patient is low-risk [place] but given his tachycardia d-dimer was ordered to further risk stratify for pulmonary embolus him. D-dimer is negative. Patient with improvement in symptoms after Toradol. Do suspect some component of pleurisy. No evidence of asthma exacerbation. His abdominal exam is soft and benign. Sponge feel the patient is appropriate for follow-up with his PCP.      ____________________________________________   FINAL CLINICAL IMPRESSION(S) / ED DIAGNOSES  Final diagnoses:  Chest pain, unspecified type      NEW MEDICATIONS STARTED DURING THIS VISIT:  New Prescriptions   TRAMADOL (ULTRAM) 50 MG TABLET    Take 1 tablet (50 mg total) by mouth every 6 (six) hours as needed.     Note:  This document was prepared using Dragon voice recognition software and may include unintentional dictation errors.    Merlyn Lot, MD 05/26/17 [phone]

## 2017-05-26 NOTE — ED Notes (Signed)
PT states he was at work and he had a huge and sharpe chest pain around 9:30pm. States his heart rate has been "jumping up and dropping" along with SOB. Family at the bedside.

## 2017-05-27 ENCOUNTER — Encounter: Payer: Self-pay | Admitting: Nurse Practitioner

## 2017-05-27 ENCOUNTER — Inpatient Hospital Stay: Payer: 59 | Admitting: Nurse Practitioner

## 2017-05-27 ENCOUNTER — Ambulatory Visit
Admission: RE | Admit: 2017-05-27 | Discharge: 2017-05-27 | Disposition: A | Discharge status: Home / Self Care | Payer: 59 | Source: Ambulatory Visit | Attending: Nurse Practitioner | Admitting: Nurse Practitioner

## 2017-05-27 ENCOUNTER — Ambulatory Visit (INDEPENDENT_AMBULATORY_CARE_PROVIDER_SITE_OTHER): Payer: 59 | Admitting: Nurse Practitioner

## 2017-05-27 VITALS — BP 109/59 | HR 53 | Temp 98.3°F | Ht 68.0 in | Wt 210.8 lb

## 2017-05-27 DIAGNOSIS — I499 Cardiac arrhythmia, unspecified: Secondary | ICD-10-CM

## 2017-05-27 DIAGNOSIS — E039 Hypothyroidism, unspecified: Secondary | ICD-10-CM

## 2017-05-27 NOTE — Progress Notes (Signed)
Subjective:    Patient ID: Philip Stephenson, male    DOB: Jul 28, 1996, 21 y.o.   MRN: [id number]  Philip Stephenson is a 21 y.o. male presenting on 05/27/2017 for Hospitalization Follow-up (chest pain, tacycardia )   HPI Emergency Room f/u Chest pain and tachycardia Heart rate increased to 190s max HR w/ fluctuations in HR remaining >100 bpm.  By time seen in ED had mostly resolved and pain was gone.  Initially, pt had sharp "doubling over" pain in left chest.  He was at work at [employer] and had helped unload a truck lifting heavy items earlier in that day.  At the time of heart rate increase, he was not doing heavy lifting or other strenuous activity.  Encounter Date: 05/26/2017 Diagnosis: pleurisy Follow up recommendations: Follow w/ PCP  Interval history:  Pt is still feeling increase in HR periodically throughout the day. He is noting heart racing and palpitations.  He wears a fitbit but has not had it on in the last day.  Repeated symptoms have occurred w/ sedentary lifestyle over the last 24 hrs and pt has not returned to work.  Denies cough or other trouble breathing, dizziness, lightheadedness and recurrence of chest pain.  Has no pain associated w/ respiratory cycle.     Father has history of afib and CHF - reportedly 2/2 lyme disease.  No other family history significant for HR irregularities.  Pt w/ new diagnosis hypothyroidism after annual exam.  Started taking levothyroxine 50 mcg daily and has not yet had TSH rechecked.  Social History  Substance Use Topics  . Smoking status: Never Smoker  . Smokeless tobacco: Never Used  . Alcohol use Yes     Comment: rarely    Review of Systems Per HPI unless specifically indicated above     Objective:    BP (!) 109/59 (BP Location: Right Arm, Patient Position: Sitting, Cuff Size: Normal)   Pulse (!) 53   Temp 98.3 F (36.8 C) (Oral)   Ht 5\' 8"  (1.727 m)   Wt 210 lb 12.8 oz (95.6 kg)   BMI 32.05 kg/m   Wt Readings from Last  3 Encounters:  05/27/17 210 lb 12.8 oz (95.6 kg)  05/25/17 202 lb 11.2 oz (91.9 kg)  04/05/17 212 lb 12.8 oz (96.5 kg)    Physical Exam  General - overweight, well-appearing, NAD HEENT - Normocephalic, atraumatic Neck - supple, non-tender, no LAD, no thyromegaly, no carotid bruit Heart - Bradycardia, rate increases with inspiration then returns to baseline, RRR w/ breath held. No murmurs heard Lungs - Clear throughout all lobes, no wheezing, crackles, or rhonchi. Normal work of breathing. Extremeties - non-tender, no edema, cap refill < 2 seconds, peripheral pulses intact +2 bilaterally Skin - warm, dry Neuro - awake, alert, oriented x3, normal gait Psych - Normal mood and affect, normal behavior     Results for orders placed or performed during the hospital encounter of 59/56/38  Basic metabolic panel  Result Value Ref Range   Sodium 141 135 - 145 mmol/L   Potassium 4.0 3.5 - 5.1 mmol/L   Chloride 105 101 - 111 mmol/L   CO2 28 22 - 32 mmol/L   Glucose, Bld 86 65 - 99 mg/dL   BUN 13 6 - 20 mg/dL   Creatinine, Ser 0.89 0.61 - 1.24 mg/dL   Calcium 9.4 8.9 - 10.3 mg/dL   GFR calc non Af Amer >60 >60 mL/min   GFR calc Af Amer >60 >60 mL/min  Anion gap 8 5 - 15  CBC  Result Value Ref Range   WBC 10.5 3.8 - 10.6 K/uL   RBC 4.82 4.40 - 5.90 MIL/uL   Hemoglobin 14.4 13.0 - 18.0 g/dL   HCT 42.8 40.0 - 52.0 %   MCV 88.8 80.0 - 100.0 fL   MCH 29.8 26.0 - 34.0 pg   MCHC 33.5 32.0 - 36.0 g/dL   RDW 13.6 11.5 - 14.5 %   Platelets 246 150 - 440 K/uL  Troponin I  Result Value Ref Range   Troponin I <0.03 <0.03 ng/mL  Fibrin derivatives D-Dimer ([facility] only)  Result Value Ref Range   Fibrin derivatives D-dimer ([facility]) 187.57 0.00 - 499.00      Assessment & Plan:   Problem List Items Addressed This Visit      Endocrine   Acquired hypothyroidism    New diagnosis hypothyroidism after labs approx 6 weeks ago.  Levothyroxine initiated at 50 mcg once daily.  Pt is taking  correctly.  Plan: 1. Continue at current dose until TSH recheck. 2. Follow up after labs.      Relevant Orders   TSH     Other   Irregular heart rate - Primary    Pt w/ episode of HR  > 190 and chest pain w/ ER visit.  No recurrences of chest pain, but has had recurrence of tachycardia since ED visit despite maintaining mostly sedentary activity level.  Respiratory sinus arrhythmia present on auscultation usually benign and is not likely sole explanation for patient's symptoms.  Pleurisy possible, but persistence of symptoms not consistent w/ this diagnosis.  Plan: 1. 48 hour holter monitor.  Place today if possible. 2. Urgent cardiology referral after persistent symptoms.  May need additional cardiac testing, but desire most useful tests per Cardiology. 3. Recheck TSH now.  We are now 6 weeks after initiation of levothyroxine for TSH 6.140 on 04/12/17. 4. Reviewed emergency situations.  Call EMS if needed for recurrent chest pain. 5. Follow up 4-6 weeks and as needed.     Relevant Orders   Ambulatory referral to Cardiology   Holter monitor - 48 hour   TSH        Follow up plan: Return in about 6 weeks (around 07/08/2017), or if symptoms worsen or fail to improve, for irregular heart rate.   Cassell Smiles, DNP, AGPCNP-BC Adult Gerontology Primary Care Nurse Practitioner [facility] 05/27/2017, 4:52 PM

## 2017-05-27 NOTE — Assessment & Plan Note (Addendum)
Pt w/ episode of HR  > 190 and chest pain w/ ER visit.  No recurrences of chest pain, but has had recurrence of tachycardia since ED visit despite maintaining mostly sedentary activity level.  Respiratory sinus arrhythmia present on auscultation usually benign and is not likely sole explanation for patient's symptoms.  Pleurisy possible, but persistence of symptoms not consistent w/ this diagnosis.  Plan: 1. 48 hour holter monitor.  Place today if possible. 2. Urgent cardiology referral after persistent symptoms.  May need additional cardiac testing, but desire most useful tests per Cardiology. 3. Recheck TSH now.  We are now 6 weeks after initiation of levothyroxine for TSH 6.140 on 04/12/17. 4. Follow up 4-6 weeks and as needed.

## 2017-05-27 NOTE — Patient Instructions (Addendum)
Philip Stephenson, Thank you for coming in to clinic today.  1. HOLTER MONITOR has been ordered and I want you to be seen by cardiology urgently (Monday or Tuesday).  Phone call for you appointment with cardiology  - Continue to have non-strenuous activity.   Please schedule a follow-up appointment with Philip Stephenson, AGNP. Return in about 6 weeks (around 07/08/2017), or if symptoms worsen or fail to improve, for irregular heart rate.   If you have any other questions or concerns, please feel free to call the clinic or send a message through [place]. You may also schedule an earlier appointment if necessary.  You will receive a survey after today's visit either digitally by e-mail or paper by [employer]. Your experiences and feedback matter to [place].  Please respond so we know how we are doing as we provide care for you.   Philip Smiles, DNP, AGNP-BC Adult Gerontology Nurse Practitioner [place]

## 2017-05-27 NOTE — Assessment & Plan Note (Signed)
New diagnosis hypothyroidism after labs approx 6 weeks ago.  Levothyroxine initiated at 50 mcg once daily.  Pt is taking correctly.  Plan: 1. Continue at current dose until TSH recheck. 2. Follow up after labs.

## 2017-05-27 NOTE — Progress Notes (Signed)
I have reviewed this encounter including the documentation in this note and/or discussed this patient with the provider, Cassell Smiles, AGPCNP-BC. I am certifying that I agree with the content of this note as supervising physician.  Nobie Putnam, [facility] 05/27/2017, 5:04 PM

## 2017-05-30 ENCOUNTER — Encounter: Payer: Self-pay | Admitting: Cardiovascular Disease

## 2017-05-30 ENCOUNTER — Ambulatory Visit (INDEPENDENT_AMBULATORY_CARE_PROVIDER_SITE_OTHER): Payer: 59 | Admitting: Cardiovascular Disease

## 2017-05-30 VITALS — BP 120/60 | Ht 67.0 in | Wt 210.0 lb

## 2017-05-30 DIAGNOSIS — R079 Chest pain, unspecified: Secondary | ICD-10-CM

## 2017-05-30 DIAGNOSIS — R0602 Shortness of breath: Secondary | ICD-10-CM | POA: Diagnosis not present

## 2017-05-30 DIAGNOSIS — I499 Cardiac arrhythmia, unspecified: Secondary | ICD-10-CM | POA: Diagnosis not present

## 2017-05-30 NOTE — Patient Instructions (Addendum)
Medication Instructions:  Your physician recommends that you continue on your current medications as directed. Please refer to the Current Medication list given to you today.   Labwork: none  Testing/Procedures: Your physician has requested that you have an echocardiogram. Echocardiography is a painless test that uses sound waves to create images of your heart. It provides your doctor with information about the size and shape of your heart and how well your heart's chambers and valves are working. This procedure takes approximately one hour. There are no restrictions for this procedure.    Follow-Up: Your physician recommends that you schedule a follow-up appointment as needed with Dr. Arida.    Any Other Special Instructions Will Be Listed Below (If Applicable).     If you need a refill on your cardiac medications before your next appointment, please call your pharmacy.  Echocardiogram An echocardiogram, or echocardiography, uses sound waves (ultrasound) to produce an image of your heart. The echocardiogram is simple, painless, obtained within a short period of time, and offers valuable information to your health care provider. The images from an echocardiogram can provide information such as:  Evidence of coronary artery disease (CAD).  Heart size.  Heart muscle function.  Heart valve function.  Aneurysm detection.  Evidence of a past heart attack.  Fluid buildup around the heart.  Heart muscle thickening.  Assess heart valve function. Tell a health care provider about:  Any allergies you have.  All medicines you are taking, including vitamins, herbs, eye drops, creams, and over-the-counter medicines.  Any problems you or family members have had with anesthetic medicines.  Any blood disorders you have.  Any surgeries you have had.  Any medical conditions you have.  Whether you are pregnant or may be pregnant. What happens before the procedure? No special  preparation is needed. Eat and drink normally. What happens during the procedure?  In order to produce an image of your heart, gel will be applied to your chest and a wand-like tool (transducer) will be moved over your chest. The gel will help transmit the sound waves from the transducer. The sound waves will harmlessly bounce off your heart to allow the heart images to be captured in real-time motion. These images will then be recorded.  You may need an IV to receive a medicine that improves the quality of the pictures. What happens after the procedure? You may return to your normal schedule including diet, activities, and medicines, unless your health care provider tells you otherwise. This information is not intended to replace advice given to you by your health care provider. Make sure you discuss any questions you have with your health care provider. Document Released: 10/08/2000 Document Revised: 05/29/2016 Document Reviewed: 06/18/2013 Elsevier Interactive Patient Education  2017 Elsevier Inc.  

## 2017-05-30 NOTE — Progress Notes (Signed)
Cardiology Office Note   Date:  05/30/2017   ID:  Philip Stephenson, DOB 10-31-1995, MRN [id number]  PCP:  Mikey College, NP  Cardiologist:   Kathlyn Sacramento, MD   Chief Complaint  Patient presents with  . other    Ref by Dr. Merrilyn Puma for irreg. heart beats. Meds reviewed by the pt. verbally. Pt. c/o shortness of breath and chest pain since last Wednesday. Wore a 48 hour holter monitor that was just turned into the hospital today to be scanned.        History of Present Illness: Philip Stephenson is a 21 y.o. male who Was referred from Cassell Smiles for evaluation of chest pain and palpitations. The patient has no previous cardiac history. He has known history of asthma. He is not a smoker. Family history is remarkable for coronary artery disease and atrial fibrillation. He studies at [employer] and works at [employer] also. He was found to have slightly elevated TSH about 6 weeks ago and was started on levothyroxin 50 g daily. He did not feel the difference between taking the medication versus not taking it. However, last week he started having episodes of fast heart beats with a heart rate going up to 140 bpm noted on his fitbit.  This was followed by substernal chest pain described as sharp and dull discomfort which was not worse with breathing or coughing. He was to the emergency room at [facility] where he was noted to be in normal sinus rhythm. His labs were unremarkable including d-dimer and troponin. He reports improved symptoms overall. He had a Holter monitor done but is still do not process. He does not drink excessive amount of alcohol. He does drink 3-4 cups of coffee daily.    Past Medical History:  Diagnosis Date  . Allergy   . Asthma     Past Surgical History:  Procedure Laterality Date  . APPENDECTOMY    . HERNIA REPAIR       Current Outpatient Prescriptions  Medication Sig Dispense Refill  . albuterol (PROVENTIL HFA;VENTOLIN HFA) 108 (90 BASE) MCG/ACT inhaler  Inhale into the lungs every 6 (six) hours as needed for wheezing or shortness of breath.    . cetirizine (ZYRTEC) 10 MG tablet Take 10 mg by mouth daily.    Marland Kitchen levothyroxine (SYNTHROID, LEVOTHROID) 50 MCG tablet Take 1 tablet (50 mcg total) by mouth daily before breakfast. 30 tablet 2  . montelukast (SINGULAIR) 10 MG tablet Take 10 mg by mouth at bedtime.     No current facility-administered medications for this visit.     Allergies:   Patient has no known allergies.    Social History:  The patient  reports that he has never smoked. He has never used smokeless tobacco. He reports that he drinks alcohol. He reports that he does not use drugs.   Family History:  The patient's family history includes Atrial fibrillation in his father; Basal cell carcinoma in his mother; Cancer in his mother; Depression in his mother, sister, and sister; Diabetes in his father and paternal grandfather; Heart disease in his father; Heart failure in his father; Prostate cancer in his paternal grandfather; Thyroid disease in his father.    ROS:  Please see the history of present illness.   Otherwise, review of systems are positive for none.   All other systems are reviewed and negative.    PHYSICAL EXAM: VS:  BP 120/60 (BP Location: Right Arm, Patient Position: Sitting, Cuff Size: Normal)  Ht 5\' 7"  (1.702 m)   Wt 210 lb (95.3 kg)   BMI 32.89 kg/m  , BMI Body mass index is 32.89 kg/m. GEN: Well nourished, well developed, in no acute distress  HEENT: normal  Neck: no JVD, carotid bruits, or masses Cardiac: RRR; no murmurs, rubs, or gallops,no edema  Respiratory:  clear to auscultation bilaterally, normal work of breathing GI: soft, nontender, nondistended, + BS MS: no deformity or atrophy  Skin: warm and dry, no rash Neuro:  Strength and sensation are intact Psych: euthymic mood, full affect   EKG:  EKG is ordered today. The ekg ordered today demonstrates  normal sinus rhythm with no significant ST or T  wave changes. There is sinus arrhythmia.   Recent Labs: 04/12/2017: ALT 25; TSH 6.140 05/26/2017: BUN 13; Creatinine, Ser 0.89; Hemoglobin 14.4; Platelets 246; Potassium 4.0; Sodium 141    Lipid Panel    Component Value Date/Time   CHOL 127 04/12/2017 0832   TRIG 115 04/12/2017 0832   HDL 42 04/12/2017 0832   CHOLHDL 3.0 04/12/2017 0832   LDLCALC 62 04/12/2017 0832      Wt Readings from Last 3 Encounters:  05/30/17 210 lb (95.3 kg)  05/27/17 210 lb 12.8 oz (95.6 kg)  05/25/17 202 lb 11.2 oz (91.9 kg)       PAD Screen 05/30/2017  Previous PAD dx? No  Previous surgical procedure? No  Pain with walking? No  Feet/toe relief with dangling? No  Painful, non-healing ulcers? No  Extremities discolored? No      ASSESSMENT AND PLAN:  1.  Palpitations and tachycardia: Likely sinus tachycardia. He does have underlying sinus arrhythmia which is not abnormal especially with his young age. I'm mostly concerned about the possibility of hyperthyroidism related to recently starting levothyroxine. I encouraged him to get follow-up TSH testing in order to exclude this and possibly decrease the dose or stop the medication. I will review the Holter monitor once processed.  I advised him to decrease caffeine intake.  2. Chest pain and shortness of breath: The setting of the above: Very low suspicion for ischemic heart disease and symptoms are overall atypical. The symptoms are not suggestive of pericarditis. I requested an echocardiogram to ensure no structural heart abnormalities.    Disposition:   FU with me as needed.   Signed,  Kathlyn Sacramento, MD  05/30/2017 4:02 PM    [facility]

## 2017-06-02 ENCOUNTER — Ambulatory Visit
Admission: RE | Admit: 2017-06-02 | Discharge: 2017-06-02 | Disposition: A | Discharge status: Home / Self Care | Payer: 59 | Source: Ambulatory Visit | Attending: Nurse Practitioner | Admitting: Nurse Practitioner

## 2017-06-02 DIAGNOSIS — I499 Cardiac arrhythmia, unspecified: Secondary | ICD-10-CM | POA: Insufficient documentation

## 2017-06-02 DIAGNOSIS — E039 Hypothyroidism, unspecified: Secondary | ICD-10-CM | POA: Diagnosis not present

## 2017-06-03 LAB — TSH: TSH: 2.01 u[IU]/mL (ref 0.450–4.500)

## 2017-06-09 ENCOUNTER — Encounter: Payer: Self-pay | Admitting: Nurse Practitioner

## 2017-06-09 ENCOUNTER — Ambulatory Visit
Admission: RE | Admit: 2017-06-09 | Discharge: 2017-06-09 | Disposition: A | Discharge status: Home / Self Care | Payer: 59 | Source: Ambulatory Visit | Attending: Nurse Practitioner | Admitting: Nurse Practitioner

## 2017-06-09 ENCOUNTER — Ambulatory Visit (INDEPENDENT_AMBULATORY_CARE_PROVIDER_SITE_OTHER): Payer: 59 | Admitting: Nurse Practitioner

## 2017-06-09 VITALS — BP 148/62 | Ht 67.0 in | Wt 211.2 lb

## 2017-06-09 DIAGNOSIS — M79671 Pain in right foot: Secondary | ICD-10-CM

## 2017-06-09 NOTE — Progress Notes (Signed)
Subjective:    Patient ID: Philip Stephenson, male    DOB: [birth year], 21 y.o.   MRN: [id number]  Philip Stephenson is a 21 y.o. male presenting on 06/09/2017 for Pain (Rt foot pain w/ no injury that the pt is aware of. Difficulty with bearing weight on the foot. x 4 days)   HPI  Right Foot Pain Right foot pain onset 4 days ago started in 5th digit to midfoot.  Is able to walk a few steps w/ pain then resolution of pain in the morning initially.  Now, every step all day is uncomfortable.  He is able to bear weight, but pt is unable to put full weight on that foot w/o pain.  Has numbing of pain after walking or standing for any length of time.  Pt walks and works on concrete floors.  Last night at work pain became bearable "numb to pain" and return of severe pain immediately after stopping for a rest. - Has pain w/ dorsiflexion and plantar flexion, weight bearing. - Pt does not recall any specific injury. - Hx 2 years ago of sprain w/o break in same foot. - Has been taking ibuprofen occasionally for pain.   Social History  Substance Use Topics  . Smoking status: Never Smoker  . Smokeless tobacco: Never Used  . Alcohol use Yes     Comment: rarely    Review of Systems Per HPI unless specifically indicated above     Objective:    BP (!) 148/62 (BP Location: Right Arm, Patient Position: Sitting, Cuff Size: Normal)   Ht 5\' 7"  (1.702 m)   Wt 211 lb 3.2 oz (95.8 kg)   BMI 33.08 kg/m   Wt Readings from Last 3 Encounters:  06/09/17 211 lb 3.2 oz (95.8 kg)  05/30/17 210 lb (95.3 kg)  05/27/17 210 lb 12.8 oz (95.6 kg)    Physical Exam  Constitutional: He is oriented to person, place, and time. He appears well-developed and well-nourished. No distress.  HENT:  Head: Normocephalic and atraumatic.  Mouth/Throat: Oropharynx is clear and moist.  Cardiovascular: Normal rate, regular rhythm, normal heart sounds and intact distal pulses.   Pulmonary/Chest: Effort normal and breath sounds  normal. No respiratory distress.  Musculoskeletal:       Right knee: Normal.       Right ankle: Normal.       Right foot: There is tenderness and bony tenderness. There is normal range of motion, no swelling, normal capillary refill, no crepitus, no deformity and no laceration.       Feet:  Normal flexion/extension of all toes 4-5th digits mostly 5th digit bony pain right foot.  Lateral foot squeeze positive for pain.   Neurological: He is alert and oriented to person, place, and time. No cranial nerve deficit.  Skin: Skin is warm and dry.  Psychiatric: He has a normal mood and affect. His behavior is normal. Judgment and thought content normal.      Results for orders placed or performed in visit on 05/27/17  TSH  Result Value Ref Range   TSH 2.010 0.450 - 4.500 uIU/mL      Assessment & Plan:   Problem List Items Addressed This Visit    None    Visit Diagnoses    Right foot pain    -  Primary Likely self-limited sprain, strain, or contusion of foot.  No bony abnormality noted.  Plan:  1. Treat with NSAIDs (acetaminophen and Aleeve).  Discussed alternate dosing and max  dosing. 2. Apply heat and/or ice to affected area. 3. May also apply a muscle rub with lidocaine after heat or ice. 4. Apply compression wrap as needed for support of foot.  Supportive shoes w/ rigid soles. 5. Xray foot today r/o fracture - Xray negative 6. Follow up as needed if no improvement in 5-7 days up to 4 weeks.    Relevant Orders   DG Foot Complete Right (Completed)        Follow up plan: Return 5-7 days if symptoms worsen or fail to improve.  Cassell Smiles, DNP, AGPCNP-BC Adult Gerontology Primary Care Nurse Practitioner [facility] 06/13/2017, 5:38 PM

## 2017-06-09 NOTE — Patient Instructions (Addendum)
Mac, Thank you for coming in to clinic today.  1. Take Aleeve or naproxen sodium 440mg  every 12 hours for 3 days, then as needed. - Use heat and ice.  Apply this for 15 minutes at a time 6-8 times per day.   - Muscle rub with lidocaine.  Avoid using this with heat and ice to avoid burns.  2. X ray of your foot will be done today.   - Wear supportive shoes (rigid soles w/o flexion) - can also use compression wrap.  Please schedule a follow-up appointment with Cassell Smiles, AGNP. Return 5-7 days if symptoms worsen or fail to improve.  If you have any other questions or concerns, please feel free to call the clinic or send a message through [place]. You may also schedule an earlier appointment if necessary.  You will receive a survey after today's visit either digitally by e-mail or paper by [employer]. Your experiences and feedback matter to [place].  Please respond so we know how we are doing as we provide care for you.   Cassell Smiles, DNP, AGNP-BC Adult Gerontology Nurse Practitioner [place]

## 2017-06-14 NOTE — Progress Notes (Signed)
I have reviewed this encounter including the documentation in this note and/or discussed this patient with the provider, Cassell Smiles, AGPCNP-BC. I am certifying that I agree with the content of this note as supervising physician.  Nobie Putnam, [facility] 06/14/2017, 8:53 AM

## 2017-06-15 ENCOUNTER — Ambulatory Visit (INDEPENDENT_AMBULATORY_CARE_PROVIDER_SITE_OTHER): Payer: 59

## 2017-06-15 ENCOUNTER — Other Ambulatory Visit: Payer: Self-pay

## 2017-06-15 DIAGNOSIS — R0602 Shortness of breath: Secondary | ICD-10-CM | POA: Diagnosis not present

## 2017-06-15 DIAGNOSIS — R079 Chest pain, unspecified: Secondary | ICD-10-CM | POA: Diagnosis not present

## 2017-07-15 ENCOUNTER — Other Ambulatory Visit: Payer: Self-pay | Admitting: Nurse Practitioner

## 2017-07-15 DIAGNOSIS — R002 Palpitations: Secondary | ICD-10-CM | POA: Diagnosis not present

## 2017-07-15 DIAGNOSIS — E039 Hypothyroidism, unspecified: Secondary | ICD-10-CM

## 2017-07-18 ENCOUNTER — Encounter: Payer: Self-pay | Admitting: Nurse Practitioner

## 2017-07-19 ENCOUNTER — Other Ambulatory Visit: Payer: Self-pay | Admitting: Nurse Practitioner

## 2017-07-19 DIAGNOSIS — E039 Hypothyroidism, unspecified: Secondary | ICD-10-CM

## 2017-07-19 MED ORDER — LEVOTHYROXINE SODIUM 50 MCG PO TABS: 50.0000 ug | ORAL_TABLET | Freq: Every day | ORAL | 1 refills | Status: DC

## 2017-09-09 DIAGNOSIS — L501 Idiopathic urticaria: Secondary | ICD-10-CM | POA: Diagnosis not present

## 2017-09-09 DIAGNOSIS — J4531 Mild persistent asthma with (acute) exacerbation: Secondary | ICD-10-CM | POA: Diagnosis not present

## 2017-09-09 DIAGNOSIS — J301 Allergic rhinitis due to pollen: Secondary | ICD-10-CM | POA: Diagnosis not present

## 2017-09-09 DIAGNOSIS — J4530 Mild persistent asthma, uncomplicated: Secondary | ICD-10-CM | POA: Diagnosis not present

## 2017-09-20 DIAGNOSIS — R05 Cough: Secondary | ICD-10-CM | POA: Diagnosis not present

## 2017-09-20 DIAGNOSIS — L501 Idiopathic urticaria: Secondary | ICD-10-CM | POA: Diagnosis not present

## 2017-09-20 DIAGNOSIS — J301 Allergic rhinitis due to pollen: Secondary | ICD-10-CM | POA: Diagnosis not present

## 2017-09-20 DIAGNOSIS — J4530 Mild persistent asthma, uncomplicated: Secondary | ICD-10-CM | POA: Diagnosis not present

## 2017-11-22 ENCOUNTER — Other Ambulatory Visit: Payer: Self-pay | Admitting: Nurse Practitioner

## 2017-11-22 DIAGNOSIS — E039 Hypothyroidism, unspecified: Secondary | ICD-10-CM

## 2018-02-01 ENCOUNTER — Other Ambulatory Visit: Payer: Self-pay

## 2018-02-01 ENCOUNTER — Encounter: Payer: Self-pay | Admitting: Nurse Practitioner

## 2018-02-01 DIAGNOSIS — E039 Hypothyroidism, unspecified: Secondary | ICD-10-CM

## 2018-02-01 MED ORDER — LEVOTHYROXINE SODIUM 50 MCG PO TABS: 50.0000 ug | ORAL_TABLET | Freq: Every day | ORAL | 0 refills | Status: DC

## 2018-02-16 ENCOUNTER — Ambulatory Visit: Payer: 59 | Admitting: Nurse Practitioner

## 2018-02-16 ENCOUNTER — Other Ambulatory Visit: Payer: Self-pay

## 2018-02-16 VITALS — BP 124/61 | HR 71 | Temp 99.1°F | Ht 67.0 in | Wt 221.0 lb

## 2018-02-16 DIAGNOSIS — M5442 Lumbago with sciatica, left side: Secondary | ICD-10-CM | POA: Diagnosis not present

## 2018-02-16 DIAGNOSIS — E039 Hypothyroidism, unspecified: Secondary | ICD-10-CM | POA: Diagnosis not present

## 2018-02-16 DIAGNOSIS — R2243 Localized swelling, mass and lump, lower limb, bilateral: Secondary | ICD-10-CM | POA: Diagnosis not present

## 2018-02-16 MED ORDER — BACLOFEN 10 MG PO TABS: 5.0000 mg | ORAL_TABLET | Freq: Three times a day (TID) | ORAL | 0 refills | Status: DC

## 2018-02-16 MED ORDER — PREDNISONE 10 MG PO TABS: ORAL_TABLET | ORAL | 0 refills | Status: DC

## 2018-02-16 MED ORDER — MEDICAL COMPRESSION SOCKS MISC: 0 refills | Status: DC

## 2018-02-16 NOTE — Patient Instructions (Addendum)
Philip Stephenson,   Thank you for coming in to clinic today.  1. Get labs collected at [place] today or tomorrow.  2. You have back muscle strain. Likely caused by heavy lifting repeatedly - Start taking Tylenol extra strength 1 to 2 tablets every 6-8 hours for aches or fever/chills for next few days as needed.  Do not take more than 3,000 mg in 24 hours from all medicines.  May take Ibuprofen as well if tolerated 400-600 mg every 8 hours as needed. May alternate tylenol and ibuprofen in same day.  Use both at max doses for 14 days or less. - Use heat and ice.  Apply this for 15 minutes at a time 6-8 times per day.   - Muscle rub with lidocaine, lidocaine patch, Biofreeze, or tiger balm for topical pain relief.  Avoid using this with heat and ice to avoid burns. - START muscle relaxer baclofen 10 mg half to one tablet up to three times daily.  This can cause drowsiness, so use caution.  It may be best to only take this at night for helping you during sleep.   Take prednisone taper 10 mg tablets Day 1 (Today): Take 6 pills at one time Day 2: Take 6 pills  Day 3: Take 5 pills Day 4: Take 4 pills Day 5: Take 3 pills Day 6: Take 2 pills Day 7: Take 1 pill then stop.  While taking prednisone, do not take ibuprofen or aleeve.  3. Compression socks - wear when standing for long periods.  Please schedule a follow-up appointment with Cassell Smiles, AGNP. Return in about 6 months (around 08/18/2018) for thyroid.  If you have any other questions or concerns, please feel free to call the clinic or send a message through [place]. You may also schedule an earlier appointment if necessary.  You will receive a survey after today's visit either digitally by e-mail or paper by [employer]. Your experiences and feedback matter to [place].  Please respond so we know how we are doing as we provide care for you.   Cassell Smiles, DNP, AGNP-BC Adult Gerontology Nurse Practitioner [place]

## 2018-02-16 NOTE — Progress Notes (Signed)
Subjective:    Patient ID: Philip Stephenson, male    DOB: 1995/11/07, 22 y.o.   MRN: [id number]  Philip Stephenson is a 22 y.o. male presenting on 02/16/2018 for Back Pain (upper back that radiates down in the left hip x 2 weeks. Pt doesn't remember injuring the back, but admits he does a lot of heavy lifting at work. x) and Hypothyroidism (pt concern that since going on the medication his weigh have increased and insomnia ,constipation and numbness in the hands.)    HPI Numbness of hands Fingertip numbness, more with weakness of 3-5th fingers has difficulty with loss of grip and pain with lifting, tingling is worse.   Leg swelling Leg swelling and fluid retention over last few weeks.  Worsens as day goes on.  Mile walk in AM, on feet from 6-11p at work.  Back Pain Back pain base of neck, shoulder blade and into left hip.  Initially thought was simply pulled muscle.  Pain is present with any type of activity.  Most pain is in upper back.  Shooting pain into low back and occasionally into left hip.  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  Does not always do buddy lifting.  Since Thursday has taken a little time off and has limited lifting to about 30 lbs. - Is taking ibuprofen 400 mg  twice daily and icing his back with mild relief, but no relief of radicular pain.  Social History   Tobacco Use  . Smoking status: Never Smoker  . Smokeless tobacco: Never Used  Substance Use Topics  . Alcohol use: Yes    Comment: rarely  . Drug use: No    Review of Systems Per HPI unless specifically indicated above     Objective:    BP 124/61 (BP Location: Right Arm, Patient Position: Sitting, Cuff Size: Normal)   Pulse 71   Temp 99.1 F (37.3 C) (Oral)   Ht 5\' 7"  (1.702 m)   Wt 221 lb (100.2 kg)   BMI 34.61 kg/m   Wt Readings from Last 3 Encounters:  02/16/18 221 lb (100.2 kg)  06/09/17  211 lb 3.2 oz (95.8 kg)  05/30/17 210 lb (95.3 kg)    Physical Exam  Constitutional: He is oriented to person, place, and time. He appears well-developed and well-nourished. No distress.  HENT:  Head: Normocephalic and atraumatic.  Neck: Normal range of motion.  Cardiovascular: Normal rate, regular rhythm, S1 normal, S2 normal, normal heart sounds and intact distal pulses.  Pulmonary/Chest: Effort normal and breath sounds normal. No respiratory distress.  Musculoskeletal:  Low Back Inspection: Normal appearance, Large body habitus, no spinal deformity, symmetrical. Palpation: No tenderness over spinous processes. Bilateral lumbar paraspinal muscles tender and with hypertonicity/spasm. ROM: Full active ROM forward flex / back extension, rotation L/R without discomfort Special Testing:Seated SLR with reproduced localized midline back pain and positive for radicular pain left leg  Strength: Bilateral hip flex/ext 5/5, knee flex/ext 5/5, ankle dorsiflex/plantarflex 5/5 Neurovascular: intact distal sensation to light touch   Neurological: He is alert and oriented to person, place, and time.  Skin: Skin is warm and dry. Capillary refill takes less than 2 seconds.  Psychiatric: He has a normal mood and affect. His behavior is normal. Judgment and thought content normal.  Vitals reviewed.    Results for orders placed or performed in visit on 05/27/17  TSH  Result  Value Ref Range   TSH 2.010 0.450 - 4.500 uIU/mL      Assessment & Plan:   Problem List Items Addressed This Visit      Endocrine   Acquired hypothyroidism  Previously stable.  Unlikely is cause for finger tingling/numbness. Recheck labs.  Continue meds without changes today.  Refills provided. Followup 3 months and after labs prn.    Relevant Orders   TSH + free T4 (Completed)   Thyrotropin receptor autoabs (Completed)    Other Visit Diagnoses    Localized swelling of both lower legs    -  Primary Dependent leg edema  worsened by body habitus and job with standing x 4 or more hours at a time.  Recommend use of compression socks during shifts.   Relevant Medications   Elastic Bandages & Supports (MEDICAL COMPRESSION SOCKS) MISC   Acute midline low back pain with left-sided sciatica     Pain likely self-limited.  Muscle strain possible complicated by job with heavy lifting.  Plan:  1. Treat with OTC pain meds (acetaminophen and ibuprofen).  Discussed alternate dosing and max dosing. 2. Apply heat and/or ice to affected area. 3. May also apply a muscle rub with lidocaine or lidocaine patch after heat or ice. 4. Take muscle relaxer baclofen up to three times daily.  Cautioned drowsiness. 5. Provided back pain exercises. 6. Start prednisone taper over 7 days Day 1-2: 60 mg, Day 3: 50 mg, Day 4: 40 mg; Day 5: 30 mg; Day 6 20 mg; Day 7: 10 mg then stop.  7. Follow up 4 weeks prn     Relevant Medications   ibuprofen (ADVIL,MOTRIN) 200 MG tablet      Meds ordered this encounter  Medications  . Elastic Bandages & Supports (MEDICAL COMPRESSION SOCKS) MISC    Sig: Use compression socks when standing for prolonged periods of time > 3 hours.  Purchase socks with 18-25 mmHg pressure.  Consider increasing to 25-35 mmHg if needed.    Dispense:  1 each    Refill:  0    Order Specific Question:   Supervising Provider    Answer:   Olin Hauser [[id number]]  . DISCONTD: baclofen (LIORESAL) 10 MG tablet    Sig: Take 0.5-1 tablets (5-10 mg total) by mouth 3 (three) times daily.    Dispense:  30 each    Refill:  0    Order Specific Question:   Supervising Provider    Answer:   Olin Hauser [[id number]]  . DISCONTD: predniSONE (DELTASONE) 10 MG tablet    Sig: Day 1-2 take 6 pills. Day 3 take 5 pills then reduce by 1 pill each day.    Dispense:  27 tablet    Refill:  0    Order Specific Question:   Supervising Provider    Answer:   Olin Hauser [[id number]]    Follow up plan: Return in about 6  months (around 08/18/2018) for thyroid.  Cassell Smiles, DNP, AGPCNP-BC Adult Gerontology Primary Care Nurse Practitioner [facility] 02/16/2018, 1:26 PM

## 2018-02-17 LAB — TSH+FREE T4
Free T4: 1.56 ng/dL (ref 0.82–1.77)
TSH: 1.44 u[IU]/mL (ref 0.450–4.500)

## 2018-02-17 LAB — THYROTROPIN RECEPTOR AUTOABS: Thyrotropin Receptor Ab: 0.59 IU/L (ref 0.00–1.75)

## 2018-02-25 ENCOUNTER — Other Ambulatory Visit: Payer: Self-pay | Admitting: Nurse Practitioner

## 2018-02-25 DIAGNOSIS — M5442 Lumbago with sciatica, left side: Secondary | ICD-10-CM

## 2018-02-27 MED ORDER — BACLOFEN 10 MG PO TABS: 5.0000 mg | ORAL_TABLET | Freq: Three times a day (TID) | ORAL | 0 refills | Status: DC

## 2018-03-02 ENCOUNTER — Other Ambulatory Visit: Payer: Self-pay

## 2018-03-02 ENCOUNTER — Ambulatory Visit: Payer: 59 | Admitting: Nurse Practitioner

## 2018-03-02 ENCOUNTER — Encounter: Payer: Self-pay | Admitting: Nurse Practitioner

## 2018-03-02 VITALS — BP 124/61 | HR 56 | Ht 67.0 in | Wt 218.2 lb

## 2018-03-02 DIAGNOSIS — M5442 Lumbago with sciatica, left side: Secondary | ICD-10-CM

## 2018-03-02 MED ORDER — CYCLOBENZAPRINE HCL 10 MG PO TABS: 10.0000 mg | ORAL_TABLET | Freq: Three times a day (TID) | ORAL | 0 refills | Status: DC | PRN

## 2018-03-02 NOTE — Progress Notes (Signed)
Subjective:    Patient ID: Philip Stephenson, male    DOB: 14-Jul-1996, 22 y.o.   MRN: [id number]  General Wearing is a 22 y.o. male presenting on 03/02/2018 for Back Pain (shooting pain. Pain worsen with lifting lore than 15 lbs. Pain while lying down )   HPI Back Pain - subacute Pt returns for followup approximately 2 weeks after last visit as pain is persistent and slightly worsened.  Pt endorses intermittent radiculopathy predominantly left side that is more frequent than in past.  Pain is also present in midline lumbar spine that is sore, sharp, and aching.  Occasionally describes this as increased pressure.   - Is having limited pain with lifting objects up to 15 lbs, but significantly more pain at and over 30 lbs.   - No additional injury noted. - Pt is reporting increased pain in lower back at end of his four hour shift than at beginning. - Prednisone course did not change experience of pain much. - Baclofen is effective for approximately 45 minutes before he notes muscles start becoming more tense again.  Social History   Tobacco Use  . Smoking status: Never Smoker  . Smokeless tobacco: Never Used  Substance Use Topics  . Alcohol use: Yes    Comment: rarely  . Drug use: No    Review of Systems Per HPI unless specifically indicated above     Objective:    BP 124/61 (BP Location: Right Arm, Patient Position: Sitting, Cuff Size: Normal)   Pulse (!) 56   Ht 5\' 7"  (1.702 m)   Wt 218 lb 3.2 oz (99 kg)   BMI 34.17 kg/m   Wt Readings from Last 3 Encounters:  03/02/18 218 lb 3.2 oz (99 kg)  02/16/18 221 lb (100.2 kg)  06/09/17 211 lb 3.2 oz (95.8 kg)    Physical Exam  Constitutional: He is oriented to person, place, and time. He appears well-developed and well-nourished. No distress.  HENT:  Head: Normocephalic and atraumatic.  Neck: Normal range of motion. Neck supple. Carotid bruit is not present.  Cardiovascular: Normal rate, regular rhythm, S1 normal, S2 normal,  normal heart sounds and intact distal pulses.  Pulmonary/Chest: Effort normal and breath sounds normal. No respiratory distress.  Musculoskeletal: He exhibits no edema (pedal).  Low Back Inspection: Normal appearance, large body habitus, no spinal deformity, symmetrical. Palpation: No tenderness over spinous processes. Bilateral lumbar paraspinal muscles mildly tender and with hypertonicity/spasm. ROM: Limited AROM forward flex / back extension limited by pain, rotation L/R with mild discomfort;  Special Testing: Seated SLR with reproduced localized L back pain and positive for L side radicular pain.  Strength: Bilateral hip flex/ext 5/5, knee flex/ext 5/5, ankle dorsiflex/plantarflex 5/5 Neurovascular: intact distal sensation to light touch   Neurological: He is alert and oriented to person, place, and time.  Skin: Skin is warm and dry.  Psychiatric: He has a normal mood and affect. His behavior is normal.  Vitals reviewed.  Results for orders placed or performed in visit on 02/16/18  TSH + free T4  Result Value Ref Range   TSH 1.440 0.450 - 4.500 uIU/mL   Free T4 1.56 0.82 - 1.77 ng/dL  Thyrotropin receptor autoabs  Result Value Ref Range   Thyrotropin Receptor Ab 0.59 0.00 - 1.75 IU/L      Assessment & Plan:   Problem List Items Addressed This Visit    None    Visit Diagnoses    Acute midline low back pain with left-sided  sciatica    -  Primary   Relevant Medications   cyclobenzaprine (FLEXERIL) 10 MG tablet   Other Relevant Orders   Ambulatory referral to Physical Therapy    Pain likely self-limited.  Muscle strain possible complicated by heavy lifting at work and repetitive overuse.  Plan:  1. Treat with OTC pain meds (acetaminophen and ibuprofen).  Discussed alternate dosing and max dosing. 2. Apply heat and/or ice to affected area. 3. May also apply a muscle rub with lidocaine or lidocaine patch after heat or ice. 4. Take muscle relaxer cyclobenzaprine 10 mg twice  daily prn.  Cautioned drowsiness. 5. Physical therapy referral for eval and treat.  Work note with additional restrictions provided. 6. Follow up 4 weeks    Meds ordered this encounter  Medications  . cyclobenzaprine (FLEXERIL) 10 MG tablet    Sig: Take 1 tablet (10 mg total) by mouth 3 (three) times daily as needed for muscle spasms.    Dispense:  30 tablet    Refill:  0    Order Specific Question:   Supervising Provider    Answer:   Olin Hauser [[id number]]    Follow up plan: Return 4 weeks for back pain if symptoms worsen or fail to improve for imaging.  Cassell Smiles, DNP, AGPCNP-BC Adult Gerontology Primary Care Nurse Practitioner [facility] 03/20/2018, 7:40 PM

## 2018-03-02 NOTE — Patient Instructions (Addendum)
Philip Stephenson,   Thank you for coming in to clinic today.  1. Change baclofen to Flexeril (cyclobenzaprine) 10 mg one tablet twice daily as needed.  2. Physical Therapy will call you to schedule your evaluation and treatment.  Please schedule a follow-up appointment with Cassell Smiles, AGNP. Return 4 weeks for back pain if symptoms worsen or fail to improve for imaging.  If you have any other questions or concerns, please feel free to call the clinic or send a message through [facility]. You may also schedule an earlier appointment if necessary.  You will receive a survey after today's visit either digitally by e-mail or paper by [employer]. Your experiences and feedback matter to [place].  Please respond so we know how we are doing as we provide care for you.   Cassell Smiles, DNP, AGNP-BC Adult Gerontology Nurse Practitioner [facility]    Low Back Pain Exercises See other page with pictures of each exercise.  Start with 1 or 2 of these exercises that you are most comfortable with. Do not do any exercises that cause you significant worsening pain. Some of these may cause some "stretching soreness" but it should go away after you stop the exercise, and get better over time. Gradually increase up to 3-4 exercises as tolerated.  Standing hamstring stretch: Place the heel of your leg on a stool about 15 inches high. Keep your knee straight. Lean forward, bending at the hips until you feel a mild stretch in the back of your thigh. Make sure you do not roll your shoulders and bend at the waist when doing this or you will stretch your lower back instead. Hold the stretch for 15 to 30 seconds. Repeat 3 times. Repeat the same stretch on your other leg.  Cat and camel: Get down on your hands and knees. Let your stomach sag, allowing your back to curve downward. Hold this position for 5 seconds. Then arch your back and hold for 5 seconds. Do 3 sets of 10.  Quadriped Arm/Leg  Raises: Get down on your hands and knees. Tighten your abdominal muscles to stiffen your spine. While keeping your abdominals tight, raise one arm and the opposite leg away from you. Hold this position for 5 seconds. Lower your arm and leg slowly and alternate sides. Do this 10 times on each side.  Pelvic tilt: Lie on your back with your knees bent and your feet flat on the floor. Tighten your abdominal muscles and push your lower back into the floor. Hold this position for 5 seconds, then relax. Do 3 sets of 10.  Partial curl: Lie on your back with your knees bent and your feet flat on the floor. Tighten your stomach muscles and flatten your back against the floor. Tuck your chin to your chest. With your hands stretched out in front of you, curl your upper body forward until your shoulders clear the floor. Hold this position for 3 seconds. Don't hold your breath. It helps to breathe out as you lift your shoulders up. Relax. Repeat 10 times. Build to 3 sets of 10. To challenge yourself, clasp your hands behind your head and keep your elbows out to the side.  Lower trunk rotation: Lie on your back with your knees bent and your feet flat on the floor. Tighten your abdominal muscles and push your lower back into the floor. Keeping your shoulders down flat, gently rotate your legs to one side, then the other as far as you  can. Repeat 10 to 20 times.  Single knee to chest stretch: Lie on your back with your legs straight out in front of you. Bring one knee up to your chest and grasp the back of your thigh. Pull your knee toward your chest, stretching your buttock muscle. Hold this position for 15 to 30 seconds and return to the starting position. Repeat 3 times on each side.  Double knee to chest: Lie on your back with your knees bent and your feet flat on the floor. Tighten your abdominal muscles and push your lower back into the floor. Pull both knees up to your chest. Hold for 5 seconds and repeat 10 to 20  times.

## 2018-03-13 DIAGNOSIS — D2239 Melanocytic nevi of other parts of face: Secondary | ICD-10-CM | POA: Diagnosis not present

## 2018-03-13 DIAGNOSIS — D485 Neoplasm of uncertain behavior of skin: Secondary | ICD-10-CM | POA: Diagnosis not present

## 2018-03-13 DIAGNOSIS — L408 Other psoriasis: Secondary | ICD-10-CM | POA: Diagnosis not present

## 2018-03-13 DIAGNOSIS — L219 Seborrheic dermatitis, unspecified: Secondary | ICD-10-CM | POA: Diagnosis not present

## 2018-03-18 ENCOUNTER — Encounter: Payer: Self-pay | Admitting: Nurse Practitioner

## 2018-03-20 ENCOUNTER — Encounter: Payer: Self-pay | Admitting: Nurse Practitioner

## 2018-04-03 ENCOUNTER — Ambulatory Visit: Payer: 59 | Admitting: Physical Therapy

## 2018-04-05 ENCOUNTER — Ambulatory Visit: Payer: 59 | Admitting: Physical Therapy

## 2018-04-11 ENCOUNTER — Encounter: Payer: Self-pay | Admitting: Nurse Practitioner

## 2018-04-11 ENCOUNTER — Encounter: Payer: 59 | Admitting: Physical Therapy

## 2018-04-13 ENCOUNTER — Encounter: Payer: 59 | Admitting: Physical Therapy

## 2018-05-06 ENCOUNTER — Other Ambulatory Visit: Payer: Self-pay | Admitting: Nurse Practitioner

## 2018-05-06 DIAGNOSIS — E039 Hypothyroidism, unspecified: Secondary | ICD-10-CM

## 2018-07-12 ENCOUNTER — Ambulatory Visit
Admission: RE | Admit: 2018-07-12 | Discharge: 2018-07-12 | Disposition: A | Discharge status: Home / Self Care | Payer: 59 | Source: Ambulatory Visit | Attending: Allergy and Immunology | Admitting: Allergy and Immunology

## 2018-07-12 ENCOUNTER — Other Ambulatory Visit: Payer: Self-pay | Admitting: Allergy and Immunology

## 2018-07-12 DIAGNOSIS — R05 Cough: Secondary | ICD-10-CM | POA: Diagnosis not present

## 2018-07-12 DIAGNOSIS — J4530 Mild persistent asthma, uncomplicated: Secondary | ICD-10-CM | POA: Diagnosis not present

## 2018-07-12 DIAGNOSIS — R059 Cough, unspecified: Secondary | ICD-10-CM

## 2018-07-12 DIAGNOSIS — L501 Idiopathic urticaria: Secondary | ICD-10-CM | POA: Diagnosis not present

## 2018-07-12 DIAGNOSIS — J301 Allergic rhinitis due to pollen: Secondary | ICD-10-CM | POA: Diagnosis not present

## 2018-08-23 ENCOUNTER — Encounter: Payer: Self-pay | Admitting: Nurse Practitioner

## 2018-08-23 DIAGNOSIS — E039 Hypothyroidism, unspecified: Secondary | ICD-10-CM

## 2018-08-23 MED ORDER — LEVOTHYROXINE SODIUM 50 MCG PO TABS: ORAL_TABLET | ORAL | 0 refills | Status: DC

## 2018-09-29 ENCOUNTER — Encounter: Payer: Self-pay | Admitting: Family Medicine

## 2018-09-29 ENCOUNTER — Ambulatory Visit: Payer: 59 | Admitting: Family Medicine

## 2018-09-29 VITALS — BP 123/73 | HR 90 | Temp 98.8°F | Resp 16 | Ht 67.0 in | Wt 219.0 lb

## 2018-09-29 DIAGNOSIS — J0100 Acute maxillary sinusitis, unspecified: Secondary | ICD-10-CM

## 2018-09-29 DIAGNOSIS — J4520 Mild intermittent asthma, uncomplicated: Secondary | ICD-10-CM | POA: Diagnosis not present

## 2018-09-29 MED ORDER — BENZONATATE 100 MG PO CAPS: 100.0000 mg | ORAL_CAPSULE | Freq: Three times a day (TID) | ORAL | 0 refills | Status: DC | PRN

## 2018-09-29 MED ORDER — AMOXICILLIN-POT CLAVULANATE 875-125 MG PO TABS: 1.0000 | ORAL_TABLET | Freq: Two times a day (BID) | ORAL | 0 refills | Status: DC

## 2018-09-29 NOTE — Progress Notes (Signed)
Subjective:    Patient ID: Philip Stephenson, male    DOB: November 29, 1995, 22 y.o.   MRN: [id number]  [name] is a 22 y.o. male presenting on 09/29/2018 for Sinus Problem (onset 5 days cough, chills,HA)  PCP is Cassell Smiles, AGPCNP-BC  HPI   ACUTE SINUSITIS Reports sinus symptoms onset about 5-7 days ago, seems improved in morning and then worse by night time. - Using OTC cold & flu medicine, helping some, controlling or reducing fever - In past few years, he has had similar sinus infections, has been treated with variety of antibiotics, usually 1-2 rounds before it works. - History of asthma, mild - rarely uses albuterol PRN with good results - Admits some subjective fever and chills at times - Denies any body aches, nausea vomiting, diarrhea, ear pain or pressure  Health Maintenance: Due for Flu Shot, declines today despite counseling on benefits   Depression screen [facility] 2/9 09/29/2018 04/05/2017  Decreased Interest 0 0  Down, Depressed, Hopeless 0 0  PHQ - 2 Score 0 0  Altered sleeping - 0  Tired, decreased energy - 0  Change in appetite - 0  Feeling bad or failure about yourself  - 0  Trouble concentrating - 0  Moving slowly or fidgety/restless - 0  Suicidal thoughts - 0  PHQ-9 Score - 0    Social History   Tobacco Use  . Smoking status: Never Smoker  . Smokeless tobacco: Never Used  Substance Use Topics  . Alcohol use: Yes    Comment: rarely  . Drug use: No    Review of Systems Per HPI unless specifically indicated above     Objective:    BP 123/73   Pulse 90   Temp 98.8 F (37.1 C) (Oral)   Resp 16   Ht 5\' 7"  (1.702 m)   Wt 219 lb (99.3 kg)   SpO2 100%   BMI 34.30 kg/m   Wt Readings from Last 3 Encounters:  09/29/18 219 lb (99.3 kg)  03/02/18 218 lb 3.2 oz (99 kg)  02/16/18 221 lb (100.2 kg)    Physical Exam  Constitutional: He is oriented to person, place, and time. He appears well-developed and well-nourished. No distress.    Well-appearing, comfortable, cooperative  HENT:  Head: Normocephalic and atraumatic.  Frontal / maxillary sinuses mild-tender L > R. Nares mostly patent with some congestion. Bilateral TMs clear without erythema, effusion or bulging. Oropharynx with bilateral tonsillar erythema seems non focal without exudates or asymmetry, post nasal drainage.  Eyes: Conjunctivae are normal. Right eye exhibits no discharge. Left eye exhibits no discharge.  Neck: Normal range of motion. Neck supple.  Cardiovascular: Normal rate.  Pulmonary/Chest: Effort normal and breath sounds normal. No respiratory distress. He has no wheezes. He has no rales.  Musculoskeletal: He exhibits no edema.  Lymphadenopathy:    He has no cervical adenopathy.  Neurological: He is alert and oriented to person, place, and time.  Skin: Skin is warm and dry. No rash noted. He is not diaphoretic. No erythema.  Psychiatric: He has a normal mood and affect. His behavior is normal.  Well groomed, good eye contact, normal speech and thoughts  Nursing note and vitals reviewed.  Results for orders placed or performed in visit on 02/16/18  TSH + free T4  Result Value Ref Range   TSH 1.440 0.450 - 4.500 uIU/mL   Free T4 1.56 0.82 - 1.77 ng/dL  Thyrotropin receptor autoabs  Result Value Ref Range   Thyrotropin  Receptor Ab 0.59 0.00 - 1.75 IU/L      Assessment & Plan:   Problem List Items Addressed This Visit    Mild intermittent asthma    Other Visit Diagnoses    Acute non-recurrent maxillary sinusitis    -  Primary   Relevant Medications   amoxicillin-clavulanate (AUGMENTIN) 875-125 MG tablet   benzonatate (TESSALON) 100 MG capsule      Consistent with acute sinusitis, likely initially viral URI vs allergic rhinitis component with worsening concern for bacterial infection given temperature instability and pain/pressure symptoms worsening, nearly duration 1 week  Additionally some cough seems related to asthma, clinically  without acute asthma exac at this time, may progress or worsening, advised to use albuterol PRN and follow-up if need consider prednisone or other therapy  Plan: 1. Start Augmentin 875-125mg  PO BID x 10 days 2. Start Tessalon Perls take 1 capsule up to 3 times a day as needed for cough 3. May use existing Atrovent if has any left over, otherwise continue allergy meds, OTC cold meds 4. Return criteria reviewed   Meds ordered this encounter  Medications  . amoxicillin-clavulanate (AUGMENTIN) 875-125 MG tablet    Sig: Take 1 tablet by mouth 2 (two) times daily. For 10 days    Dispense:  20 tablet    Refill:  0  . benzonatate (TESSALON) 100 MG capsule    Sig: Take 1 capsule (100 mg total) by mouth 3 (three) times daily as needed for cough.    Dispense:  30 capsule    Refill:  0    Follow up plan: Return in about 1 week (around 10/06/2018), or if symptoms worsen or fail to improve, for sinusitis / asthma.   Nobie Putnam, [facility] 09/29/2018, 10:06 AM

## 2018-09-29 NOTE — Patient Instructions (Addendum)
Thank you for coming to the office today.  1. It sounds like you have a Sinusitis (Bacterial Infection) - this most likely started as an Upper Respiratory Virus that has settled into an infection. Allergies can also cause this. - Start Augmentin 1 pill twice daily (breakfast and dinner, with food and plenty of water) for 10 days, complete entire course, do not stop early even if feeling better - Continue allergy and OTC cold sinus medicine  ADD Tessalon Perls take 1 capsule up to 3 times a day as needed for cough  Continue Albuterol regularly for cough - may consider Prednisone in future if not improving - call us if need  - Recommend to try using Nasal Saline spray multiple times a day to help flush out congestion and clear sinuses - Improve hydration by drinking plenty of clear fluids (water, gatorade) to reduce secretions and thin congestion - Congestion draining down throat can cause irritation. May try warm herbal tea with honey, cough drops - Can take Tylenol or Ibuprofen as needed for fevers -  If you develop persistent fever >101F for at least 3 consecutive days, headaches with sinus pain or pressure or persistent earache, please schedule a follow-up evaluation within next few days to week.   Please schedule a Follow-up Appointment to: Return in about 1 week (around 10/06/2018), or if symptoms worsen or fail to improve, for sinusitis / asthma.  If you have any other questions or concerns, please feel free to call the office or send a message through [place]. You may also schedule an earlier appointment if necessary.  Additionally, you may be receiving a survey about your experience at our office within a few days to 1 week by e-mail or mail. We value your feedback.  Nobie Putnam, DO [place]

## 2018-10-01 IMAGING — CR DG CHEST 2V
2 series · 2 of 2 positions shown · non-contrast
Comparison: None.

CLINICAL DATA: Left chest pain radiating to the left neck for the
past 2 hours with shortness of breath.

EXAM:
CHEST  2 VIEW

[chest pa]
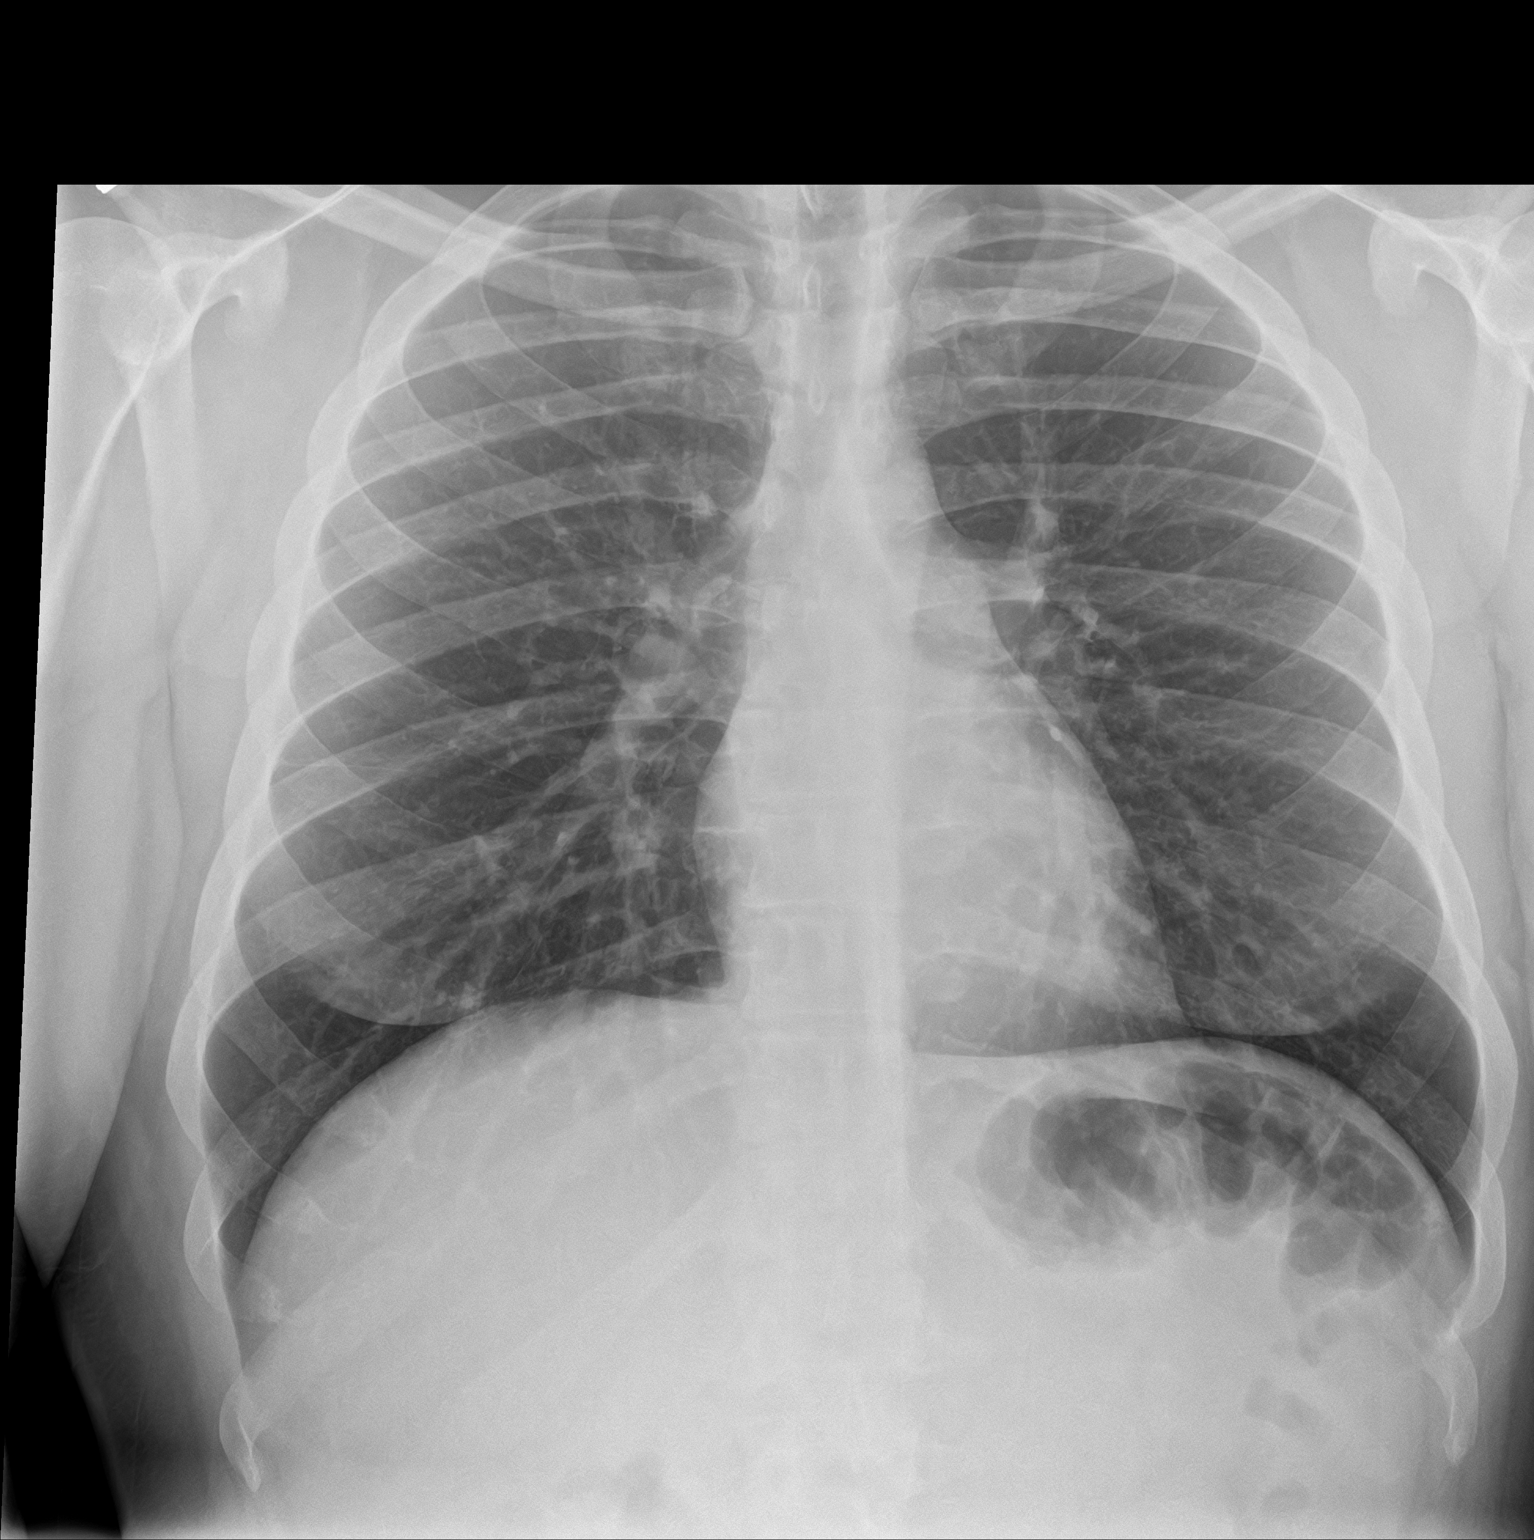

[chest lat]
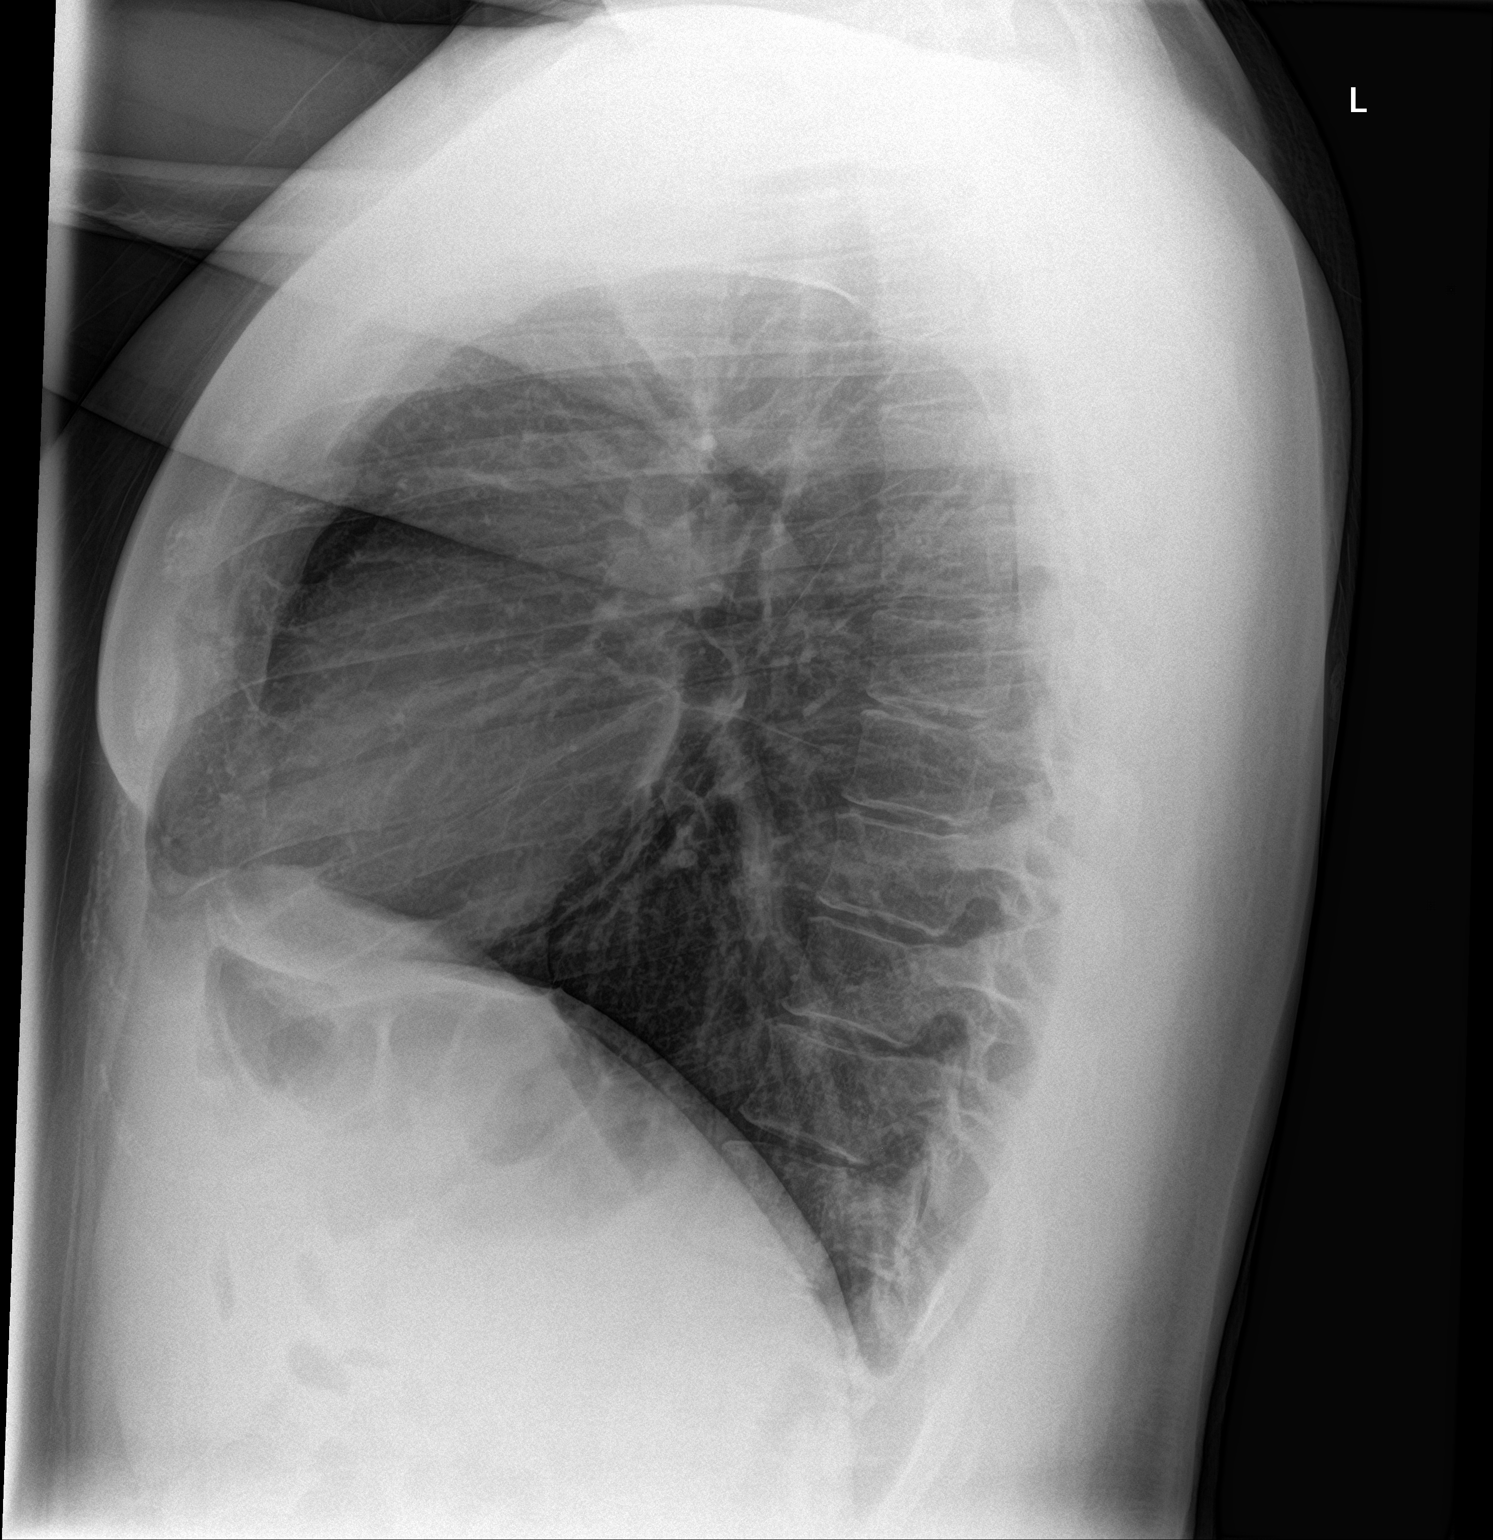

[2 of 2 positions shown; findings below may reference images not displayed]

FINDINGS: The heart size and mediastinal contours are within normal limits.
Both lungs are clear. The visualized skeletal structures are
unremarkable.
IMPRESSION: Normal examination.

## 2018-11-20 ENCOUNTER — Other Ambulatory Visit: Payer: Self-pay | Admitting: Nurse Practitioner

## 2018-11-20 DIAGNOSIS — E039 Hypothyroidism, unspecified: Secondary | ICD-10-CM

## 2018-11-21 NOTE — Telephone Encounter (Signed)
Left detail message. 

## 2018-12-12 ENCOUNTER — Encounter: Payer: Self-pay | Admitting: Nurse Practitioner

## 2018-12-12 NOTE — Telephone Encounter (Signed)
Mychart message

## 2018-12-19 LAB — TSH: TSH: 2.13 u[IU]/mL (ref 0.450–4.500)

## 2018-12-19 MED ORDER — LEVOTHYROXINE SODIUM 50 MCG PO TABS: ORAL_TABLET | ORAL | 1 refills | Status: DC

## 2019-01-24 ENCOUNTER — Other Ambulatory Visit: Payer: Self-pay | Admitting: Nurse Practitioner

## 2019-01-24 DIAGNOSIS — M5442 Lumbago with sciatica, left side: Secondary | ICD-10-CM

## 2019-07-04 ENCOUNTER — Encounter: Payer: Self-pay | Admitting: Nurse Practitioner

## 2019-07-04 DIAGNOSIS — E039 Hypothyroidism, unspecified: Secondary | ICD-10-CM

## 2019-07-05 MED ORDER — LEVOTHYROXINE SODIUM 50 MCG PO TABS: ORAL_TABLET | ORAL | 1 refills | Status: DC

## 2019-07-09 ENCOUNTER — Encounter: Payer: Self-pay | Admitting: Nurse Practitioner

## 2019-07-09 DIAGNOSIS — Z87438 Personal history of other diseases of male genital organs: Secondary | ICD-10-CM

## 2019-07-09 MED ORDER — LEVOTHYROXINE SODIUM 50 MCG PO TABS: ORAL_TABLET | ORAL | 1 refills | Status: DC

## 2019-07-18 LAB — TSH+FREE T4
Free T4: 1.5 ng/dL (ref 0.82–1.77)
TSH: 1.92 u[IU]/mL (ref 0.450–4.500)

## 2019-07-31 ENCOUNTER — Telehealth: Payer: 59 | Admitting: Nurse Practitioner

## 2019-07-31 ENCOUNTER — Telehealth: Payer: Self-pay | Admitting: *Deleted

## 2019-07-31 ENCOUNTER — Other Ambulatory Visit: Payer: Self-pay | Admitting: Nurse Practitioner

## 2019-07-31 ENCOUNTER — Encounter (INDEPENDENT_AMBULATORY_CARE_PROVIDER_SITE_OTHER): Payer: Self-pay

## 2019-07-31 DIAGNOSIS — R059 Cough, unspecified: Secondary | ICD-10-CM

## 2019-07-31 DIAGNOSIS — Z20828 Contact with and (suspected) exposure to other viral communicable diseases: Secondary | ICD-10-CM

## 2019-07-31 DIAGNOSIS — R0602 Shortness of breath: Secondary | ICD-10-CM

## 2019-07-31 DIAGNOSIS — R509 Fever, unspecified: Secondary | ICD-10-CM

## 2019-07-31 DIAGNOSIS — R05 Cough: Secondary | ICD-10-CM | POA: Diagnosis not present

## 2019-07-31 DIAGNOSIS — Z20822 Contact with and (suspected) exposure to covid-19: Secondary | ICD-10-CM

## 2019-07-31 MED ORDER — ALBUTEROL SULFATE HFA 108 (90 BASE) MCG/ACT IN AERS: 2.0000 | INHALATION_SPRAY | Freq: Four times a day (QID) | RESPIRATORY_TRACT | 0 refills | Status: DC | PRN

## 2019-07-31 MED ORDER — BENZONATATE 100 MG PO CAPS: 100.0000 mg | ORAL_CAPSULE | Freq: Three times a day (TID) | ORAL | 0 refills | Status: DC | PRN

## 2019-07-31 NOTE — Telephone Encounter (Signed)
Called pt regarding his answers to the [place]. He stated that he has become increasingly short of breath since yesterday. He has a hx of asthma. He is on asthma mediations. Feels like his cough is worst, productive yellow phlegm. And he does not have an appetite. He is advised to go to the the ED if he feels like he is struggling to breath.  To try honey, lemon and hot tea along with other the counter cough medications. His cough is worst at night. He asked if he should be tested for covid-19. Advised that he should be tested. To call his provider for more recommendations. Advised to go to the [employer] to get tested. Continue to wear a mask and social distance. He works at [employer]. He voiced understanding.

## 2019-07-31 NOTE — Progress Notes (Signed)
E-Visit for Corona Virus Screening   Your current symptoms could be consistent with the coronavirus.  Many health care providers can now test patients at their office but not all are.  [facility] has multiple testing sites. For information on our COVID testing locations and hours go to [email or web address]  Please quarantine yourself while awaiting your test results.  We are enrolling you in our [facility] for [place] . Daily you will receive a questionnaire within the [facility] website. Our COVID 19 response team willl be monitoriing your responses daily.  You can go to one of the  testing sites listed below, while they are opened (see hours). You do not need a doctors order to be tested for covid.You do need to self-isolate until your results return and if positive 14 days from when your symptoms started and until you are 3 days symptom free.   Testing Locations (Monday - Friday, 8 a.m. - 3:30 p.m.)   [place]: [facility] Entrance), [address] [place], [place]: [address], [place], [place], [place] (entrance off [place] (Closed each Monday): [address] [place], [place] - the short stay covered drive at [facility] (Use the [employer] entrance to [facility] next to [place] [address]    COVID-19 is a respiratory illness with symptoms that are similar to the flu. Symptoms are typically mild to moderate, but there have been cases of severe illness and death due to the virus. The following symptoms may appear 2-14 days after exposure: . Fever . Cough . Shortness of breath or difficulty breathing . Chills . Repeated shaking with chills . Muscle pain . Headache . Sore throat . New loss of taste or smell . Fatigue . Congestion or runny nose . Nausea or  vomiting . Diarrhea  It is vitally important that if you feel that you have an infection such as this virus or any other virus that you stay home and away from places where you may spread it to others.  You should self-quarantine for 14 days if you have symptoms that could potentially be coronavirus or have been in close contact a with a person diagnosed with COVID-19 within the last 2 weeks. You should avoid contact with people age 45 and older.   You should wear a mask or cloth face covering over your nose and mouth if you must be around other people or animals, including pets (even at home). Try to stay at least 6 feet away from other people. This will protect the people around you.  You can use medication such as A prescription cough medication called Tessalon Perles 100 mg. You may take 1-2 capsules every 8 hours as needed for cough and A prescription inhaler called Albuterol MDI 90 mcg /actuation 2 puffs every 4 hours as needed for shortness of breath, wheezing, cough  You may also take acetaminophen (Tylenol) as needed for fever.   Reduce your risk of any infection by using the same precautions used for avoiding the common cold or flu:  [place] Kitchen Wash your hands often with soap and warm water for at least 20 seconds.  If soap and water are not readily available, use an alcohol-based hand sanitizer with at least 60% alcohol.  . If coughing or sneezing, cover your mouth and nose by coughing or sneezing into  the elbow areas of your shirt or coat, into a tissue or into your sleeve (not your hands). . Avoid shaking hands with others and consider head nods or verbal greetings only. . Avoid touching your eyes, nose, or mouth with unwashed hands.  . Avoid close contact with people who are sick. . Avoid places or events with large numbers of people in one location, like concerts or sporting events. . Carefully consider travel plans you have or are making. . If you are planning any travel outside or inside  the [place], visit the CDC's Travelers' Health webpage for the latest health notices. . If you have some symptoms but not all symptoms, continue to monitor at home and seek medical attention if your symptoms worsen. . If you are having a medical emergency, call 911.  HOME CARE . Only take medications as instructed by your medical team. . Drink plenty of fluids and get plenty of rest. . A steam or ultrasonic humidifier can help if you have congestion.   GET HELP RIGHT AWAY IF YOU HAVE EMERGENCY WARNING SIGNS** FOR COVID-19. If you or someone is showing any of these signs seek emergency medical care immediately. Call 911 or proceed to your closest emergency facility if: . You develop worsening high fever. . Trouble breathing . Bluish lips or face . Persistent pain or pressure in the chest . New confusion . Inability to wake or stay awake . You cough up blood. . Your symptoms become more severe  **This list is not all possible symptoms. Contact your medical provider for any symptoms that are sever or concerning to you.   MAKE SURE YOU   Understand these instructions.  Will watch your condition.  Will get help right away if you are not doing well or get worse.  Your e-visit answers were reviewed by a board certified advanced clinical practitioner to complete your personal care plan.  Depending on the condition, your plan could have included both over the counter or prescription medications.  If there is a problem please reply once you have received a response from your provider.  Your safety is important to [place].  If you have drug allergies check your prescription carefully.    You can use MyChart to ask questions about today's visit, request a non-urgent call back, or ask for a work or school excuse for 24 hours related to this e-Visit. If it has been greater than 24 hours you will need to follow up with your provider, or enter a new e-Visit to address those concerns. You will get an e-mail  in the next two days asking about your experience.  I hope that your e-visit has been valuable and will speed your recovery. Thank you for using e-visits.   5-10 minutes spent reviewing and documenting in chart.

## 2019-08-01 ENCOUNTER — Other Ambulatory Visit: Payer: Self-pay | Admitting: *Deleted

## 2019-08-01 DIAGNOSIS — Z20822 Contact with and (suspected) exposure to covid-19: Secondary | ICD-10-CM

## 2019-08-02 ENCOUNTER — Encounter (INDEPENDENT_AMBULATORY_CARE_PROVIDER_SITE_OTHER): Payer: Self-pay

## 2019-08-03 LAB — NOVEL CORONAVIRUS, NAA: SARS-CoV-2, NAA: NOT DETECTED

## 2019-08-06 ENCOUNTER — Encounter (INDEPENDENT_AMBULATORY_CARE_PROVIDER_SITE_OTHER): Payer: Self-pay

## 2019-08-08 ENCOUNTER — Telehealth: Payer: Self-pay

## 2019-08-08 ENCOUNTER — Ambulatory Visit: Payer: 59 | Admitting: Nurse Practitioner

## 2019-08-08 ENCOUNTER — Other Ambulatory Visit: Payer: Self-pay

## 2019-08-08 NOTE — Telephone Encounter (Signed)
The pt comes in the office today for his 8:40 am appt that he scheduled on his on through mychart. The pt have been sick for the past 3 weeks. He had a covid test that came back negative recently. The pt complains of having trouble breathing. His other symptoms is nausea, chills, cough, inability to focus, headaches, congestion, and feeling exhausted all the time. I informed the patient that because of his symptoms are symptoms of COVID-19 we would address over the phone through a telephone visit.  The pt declined the telephone visit stating, "how is the provider going to listen to my lungs over the phone." I informed him that if he feel like he need to be seen face to face he will need to go to an Urgent care where they are more equipped to address his concerns. He stated so you rather me expose the people at the Urgent Care. I inform him again that they are more equipped to handle his needs. He states that his COVID test came back negative so he doesn't understand the concern. I informed him that sometimes the test comes back with a false negative and that it is a possibility  he could have had the test to early. He said that he is going to go be seen by someone who is welling to address his concern and hung up the phone.

## 2019-08-08 NOTE — Telephone Encounter (Signed)
Correct action per [facility] policy taken for converting to virtual visit for Covid symptoms.  We will address patient's concerns via telephone and send for chest xray if indicated if patient desires care from our clinic.

## 2019-08-10 ENCOUNTER — Other Ambulatory Visit: Payer: Self-pay

## 2019-08-13 ENCOUNTER — Encounter: Payer: Self-pay | Admitting: Urology

## 2019-08-13 ENCOUNTER — Other Ambulatory Visit: Payer: Self-pay

## 2019-08-13 ENCOUNTER — Ambulatory Visit: Payer: 59 | Admitting: Urology

## 2019-08-13 VITALS — BP 154/75 | HR 81 | Ht 67.0 in | Wt 239.5 lb

## 2019-08-13 DIAGNOSIS — Q5522 Retractile testis: Secondary | ICD-10-CM

## 2019-08-13 DIAGNOSIS — N50812 Left testicular pain: Secondary | ICD-10-CM | POA: Diagnosis not present

## 2019-08-14 ENCOUNTER — Encounter: Payer: Self-pay | Admitting: Urology

## 2019-08-14 NOTE — Progress Notes (Signed)
08/13/2019 10:58 AM   Philip Stephenson Aug 01, 1996 [id number]  Referring provider: Mikey College, NP Philip Stephenson,  Philip Stephenson [id number]  Chief Complaint  Patient presents with  . Testicle Pain    HPI: 23 y.o. male seen in consultation at the request of Philip Smiles, NP for chronic scrotal pain.  He states he underwent bilateral hernia repairs at age 41-month.  He has a several year history of intermittent left hemiscrotal pain.  He states his left testis is high riding and at times he will experience increased pain more related to increased activity.  He has no bothersome lower urinary tract symptoms.   PMH: Past Medical History:  Diagnosis Date  . Allergy   . Asthma     Surgical History: Past Surgical History:  Procedure Laterality Date  . APPENDECTOMY    . HERNIA REPAIR      Home Medications:  Allergies as of 08/13/2019   No Known Allergies     Medication List       Accurate as of August 13, 2019 11:59 PM. If you have any questions, ask your nurse or doctor.        albuterol 108 (90 Base) MCG/ACT inhaler Commonly known as: VENTOLIN HFA Inhale 2 puffs into the lungs every 6 (six) hours as needed for wheezing or shortness of breath.   cetirizine 10 MG tablet Commonly known as: ZYRTEC Take 10 mg by mouth daily.   levothyroxine 50 MCG tablet Commonly known as: SYNTHROID TAKE 1 TABLET(50 MCG) BY MOUTH DAILY BEFORE BREAKFAST.   Medical Compression Socks Misc Use compression socks when standing for prolonged periods of time > 3 hours.  Purchase socks with 18-25 mmHg pressure.  Consider increasing to 25-35 mmHg if needed.   meloxicam 15 MG tablet Commonly known as: MOBIC TK 1 T PO QD   montelukast 10 MG tablet Commonly known as: SINGULAIR Take 10 mg by mouth at bedtime.       Allergies: No Known Allergies  Family History: Family History  Problem Relation Age of Onset  . Basal cell carcinoma Mother   . Depression Mother   . Cancer  Mother        basal cell carcinoma nose  . Diabetes Father   . Thyroid disease Father   . Atrial fibrillation Father   . Heart failure Father   . Heart disease Father   . Depression Sister   . Prostate cancer Paternal Grandfather   . Diabetes Paternal Grandfather   . Depression Sister     Social History:  reports that he has never smoked. He has never used smokeless tobacco. He reports current alcohol use. He reports that he does not use drugs.  ROS: UROLOGY Frequent Urination?: No Hard to postpone urination?: No Burning/pain with urination?: No Get up at night to urinate?: No Leakage of urine?: No Urine stream starts and stops?: No Trouble starting stream?: No Do you have to strain to urinate?: No Blood in urine?: No Urinary tract infection?: No Sexually transmitted disease?: No Injury to kidneys or bladder?: No Painful intercourse?: Yes Weak stream?: No Erection problems?: No Penile pain?: No  Gastrointestinal Nausea?: No Vomiting?: No Indigestion/heartburn?: No Diarrhea?: No Constipation?: No  Constitutional Fever: No Night sweats?: No Weight loss?: No Fatigue?: No  Skin Skin rash/lesions?: No Itching?: No  Eyes Blurred vision?: No Double vision?: No  Ears/Nose/Throat Sore throat?: No Sinus problems?: No  Hematologic/Lymphatic Swollen glands?: No Easy bruising?: No  Cardiovascular Leg swelling?: No Chest  pain?: No  Respiratory Cough?: No Shortness of breath?: No  Endocrine Excessive thirst?: No  Musculoskeletal Back pain?: No Joint pain?: No  Neurological Headaches?: No Dizziness?: No  Psychologic Depression?: No Anxiety?: No  Physical Exam: BP (!) 154/75 (BP Location: Left Arm, Patient Position: Sitting, Cuff Size: Large)   Pulse 81   Ht 5\' 7"  (1.702 m)   Wt 239 lb 8 oz (108.6 kg)   BMI 37.51 kg/m   Constitutional:  Alert and oriented, No acute distress. HEENT: [place] AT, moist mucus membranes.  Trachea midline, no masses.  Cardiovascular: No clubbing, cyanosis, or edema. Respiratory: Normal respiratory effort, no increased work of breathing. GI: Abdomen is soft, nontender, nondistended, no abdominal masses GU: Phallus without lesions.  Right testis descended and palpably normal.  The left testis is retractile and at the external inguinal ring.  The testis can be brought down to the scrotum where it remains but will then retract superiorly.  No testis mass or tenderness. Skin: No rashes, bruises or suspicious lesions. Neurologic: Grossly intact, no focal deficits, moving all 4 extremities. Psychiatric: Normal mood and affect.   Assessment & Plan:    - Retractile left testis with chronic scrotal pain His testis does retract at least to within the external inguinal ring and he is symptomatic.  Will check to see if any urologist in the area perform surgery for symptomatic retractile testes in adults.   Philip Stephenson, [place] [address] [place], [place] [address] [phone]

## 2019-08-20 ENCOUNTER — Encounter: Payer: Self-pay | Admitting: Urology

## 2019-08-21 ENCOUNTER — Other Ambulatory Visit: Payer: Self-pay

## 2019-08-21 ENCOUNTER — Ambulatory Visit
Admission: RE | Admit: 2019-08-21 | Discharge: 2019-08-21 | Disposition: A | Discharge status: Home / Self Care | Payer: 59 | Source: Ambulatory Visit | Attending: Physician Assistant | Admitting: Physician Assistant

## 2019-08-21 ENCOUNTER — Encounter: Payer: Self-pay | Admitting: Physician Assistant

## 2019-08-21 ENCOUNTER — Ambulatory Visit: Payer: 59 | Admitting: Physician Assistant

## 2019-08-21 VITALS — BP 134/86 | HR 76 | Ht 67.0 in | Wt 239.0 lb

## 2019-08-21 DIAGNOSIS — R109 Unspecified abdominal pain: Secondary | ICD-10-CM

## 2019-08-21 LAB — MICROSCOPIC EXAMINATION
Bacteria, UA: NONE SEEN
RBC, Urine: NONE SEEN /hpf (ref 0–2)
WBC, UA: NONE SEEN /hpf (ref 0–5)

## 2019-08-21 LAB — URINALYSIS, COMPLETE
Bilirubin, UA: NEGATIVE
Glucose, UA: NEGATIVE
Ketones, UA: NEGATIVE
Leukocytes,UA: NEGATIVE
Nitrite, UA: NEGATIVE
Protein,UA: NEGATIVE
RBC, UA: NEGATIVE
Specific Gravity, UA: 1.025 (ref 1.005–1.030)
Urobilinogen, Ur: 1 mg/dL (ref 0.2–1.0)
pH, UA: 6.5 (ref 5.0–7.5)

## 2019-08-21 NOTE — Progress Notes (Signed)
08/21/2019 4:38 PM   [name] 1995/12/26 [id number]  CC: Bilateral flank pain, L>R  HPI: Philip Stephenson is a 23 y.o. male who presents today for evaluation of bilateral flank pain. He is an established [facility] patient who last saw Dr. Bernardo Heater on 08/13/2019 for initial evaluation of chronic scrotal pain.  He was found to have symptomatic retractile testes at that visit.  Per his note, Dr. Bernardo Heater planned to investigate whether any local urologists perform surgery for this condition at that time.  He reports a 4-day history of chills, nausea, bilateral flank pain L>R, and dysuria. He denies gross hematuria, fevers, vomiting, and trauma. He has taken acetaminophen and ibuprofen at home with moderate symptom palliation. He states the pain is worse with increased intra-abdominal pressure.    He does not have a personal history of nephrolithiasis.  He believes he may have a family history of nephrolithiasis.    He reports infrequent abdominal and flank pain associated with his symptomatic retractile testes.  He believes his current complaint is atypical for his chronic scrotal pain.  In-office UA today pan-negative.   PMH: Past Medical History:  Diagnosis Date  . Allergy   . Asthma     Surgical History: Past Surgical History:  Procedure Laterality Date  . APPENDECTOMY    . HERNIA REPAIR      Home Medications:  Allergies as of 08/21/2019   No Known Allergies     Medication List       Accurate as of August 21, 2019  4:38 PM. If you have any questions, ask your nurse or doctor.        albuterol 108 (90 Base) MCG/ACT inhaler Commonly known as: VENTOLIN HFA Inhale 2 puffs into the lungs every 6 (six) hours as needed for wheezing or shortness of breath.   cetirizine 10 MG tablet Commonly known as: ZYRTEC Take 10 mg by mouth daily.   levothyroxine 50 MCG tablet Commonly known as: SYNTHROID TAKE 1 TABLET(50 MCG) BY MOUTH DAILY BEFORE BREAKFAST.   Medical  Compression Socks Misc Use compression socks when standing for prolonged periods of time > 3 hours.  Purchase socks with 18-25 mmHg pressure.  Consider increasing to 25-35 mmHg if needed.   meloxicam 15 MG tablet Commonly known as: MOBIC TK 1 T PO QD   montelukast 10 MG tablet Commonly known as: SINGULAIR Take 10 mg by mouth at bedtime.       Allergies:  No Known Allergies  Family History: Family History  Problem Relation Age of Onset  . Basal cell carcinoma Mother   . Depression Mother   . Cancer Mother        basal cell carcinoma nose  . Diabetes Father   . Thyroid disease Father   . Atrial fibrillation Father   . Heart failure Father   . Heart disease Father   . Depression Sister   . Prostate cancer Paternal Grandfather   . Diabetes Paternal Grandfather   . Depression Sister     Social History:   reports that he has never smoked. He has never used smokeless tobacco. He reports current alcohol use. He reports that he does not use drugs.  ROS: UROLOGY Frequent Urination?: No Hard to postpone urination?: No Burning/pain with urination?: Yes Get up at night to urinate?: No Leakage of urine?: No Urine stream starts and stops?: No Trouble starting stream?: No Do you have to strain to urinate?: No Blood in urine?: No Urinary tract infection?: No Sexually transmitted  disease?: No Injury to kidneys or bladder?: No Painful intercourse?: No Weak stream?: No Erection problems?: No Penile pain?: No  Gastrointestinal Nausea?: No Vomiting?: No Indigestion/heartburn?: No Diarrhea?: No Constipation?: No  Constitutional Fever: No Night sweats?: No Weight loss?: No Fatigue?: No  Skin Skin rash/lesions?: No Itching?: No  Eyes Blurred vision?: No Double vision?: No  Ears/Nose/Throat Sore throat?: No Sinus problems?: No  Hematologic/Lymphatic Swollen glands?: No Easy bruising?: No  Cardiovascular Leg swelling?: No Chest pain?: No  Respiratory  Cough?: No Shortness of breath?: No  Endocrine Excessive thirst?: No  Musculoskeletal Back pain?: No Joint pain?: No  Neurological Headaches?: No Dizziness?: No  Psychologic Depression?: No Anxiety?: No  Physical Exam: BP 134/86   Pulse 76   Ht 5\' 7"  (1.702 m)   Wt 239 lb (108.4 kg)   BMI 37.43 kg/m   Constitutional:  Alert and oriented, no acute distress, nontoxic appearing HEENT: [place], AT Cardiovascular: No clubbing, cyanosis, or edema Respiratory: Normal respiratory effort, no increased work of breathing Skin: No rashes, bruises or suspicious lesions Neurologic: Grossly intact, no focal deficits, moving all 4 extremities Psychiatric: Normal mood and affect  Laboratory Data: Results for orders placed or performed in visit on 08/21/19  Microscopic Examination   URINE  Result Value Ref Range   WBC, UA None seen 0 - 5 /hpf   RBC None seen 0 - 2 /hpf   Epithelial Cells (non renal) 0-10 0 - 10 /hpf   Bacteria, UA None seen None seen/Few  Urinalysis, Complete  Result Value Ref Range   Specific Gravity, UA 1.025 1.005 - 1.030   pH, UA 6.5 5.0 - 7.5   Color, UA Yellow Yellow   Appearance Ur Clear Clear   Leukocytes,UA Negative Negative   Protein,UA Negative Negative/Trace   Glucose, UA Negative Negative   Ketones, UA Negative Negative   RBC, UA Negative Negative   Bilirubin, UA Negative Negative   Urobilinogen, Ur 1.0 0.2 - 1.0 mg/dL   Nitrite, UA Negative Negative   Microscopic Examination See below:    Assessment & Plan:   1. Bilateral flank pain 23 year old male with a 4-day history of bilateral flank pain, L>R, in the setting of chronic scrotal pain likely due to retractile testes.  No personal history of nephrolithiasis, UA negative for pyuria and hematuria today.  I believe his current presentation may be consistent with an acute stone episode.  Ordering KUB today for further evaluation. - Urinalysis, Complete - DG Abd 1 View  Return if symptoms worsen  or fail to improve.  Debroah Loop, PA-C  [facility] [address], [place], [place] [address] [phone]

## 2019-08-22 ENCOUNTER — Other Ambulatory Visit: Payer: Self-pay | Admitting: Physician Assistant

## 2019-08-22 DIAGNOSIS — R109 Unspecified abdominal pain: Secondary | ICD-10-CM

## 2019-08-22 NOTE — Telephone Encounter (Signed)
Contacted the patient to discuss the results of his KUB.  I explained that there were no stones visualized.  I explained that I would like to evaluate this further with a CT scan.  CT stone study orders placed today.  I explained that ideally like this done within the next week or 2.  Patient states his pain is about the same at a maximum of 6 out of 10 in intensity.  I counseled him to contact the office if he has acute worsening of his symptoms in the interim.  He expressed understanding.

## 2019-08-27 ENCOUNTER — Other Ambulatory Visit: Payer: Self-pay | Admitting: Physician Assistant

## 2019-08-27 DIAGNOSIS — R109 Unspecified abdominal pain: Secondary | ICD-10-CM

## 2019-08-27 DIAGNOSIS — R1084 Generalized abdominal pain: Secondary | ICD-10-CM

## 2019-08-29 ENCOUNTER — Other Ambulatory Visit: Payer: Self-pay

## 2019-08-29 ENCOUNTER — Ambulatory Visit
Admission: RE | Admit: 2019-08-29 | Discharge: 2019-08-29 | Disposition: A | Discharge status: Home / Self Care | Payer: 59 | Source: Ambulatory Visit | Attending: Physician Assistant | Admitting: Physician Assistant

## 2019-08-29 DIAGNOSIS — R109 Unspecified abdominal pain: Secondary | ICD-10-CM

## 2019-09-03 ENCOUNTER — Telehealth: Payer: Self-pay | Admitting: Physician Assistant

## 2019-09-03 NOTE — Telephone Encounter (Signed)
I just spoke with the patient via telephone to report the results of his recent CT scan.  I explained that there were no stones visualized to explain his symptoms.  He reports improvement of his symptoms since I saw him in clinic approximately 2 weeks ago.  I advised him to contact the office if his symptoms worsen.  He expressed understanding.

## 2019-10-03 ENCOUNTER — Other Ambulatory Visit: Payer: Self-pay

## 2019-10-03 DIAGNOSIS — Z20822 Contact with and (suspected) exposure to covid-19: Secondary | ICD-10-CM

## 2019-10-05 LAB — NOVEL CORONAVIRUS, NAA: SARS-CoV-2, NAA: NOT DETECTED

## 2019-11-06 ENCOUNTER — Telehealth: Payer: 59 | Admitting: Nurse Practitioner

## 2019-11-06 DIAGNOSIS — B9789 Other viral agents as the cause of diseases classified elsewhere: Secondary | ICD-10-CM

## 2019-11-06 DIAGNOSIS — J329 Chronic sinusitis, unspecified: Secondary | ICD-10-CM | POA: Diagnosis not present

## 2019-11-06 MED ORDER — FLUTICASONE PROPIONATE 50 MCG/ACT NA SUSP: 2.0000 | Freq: Every day | NASAL | 6 refills | Status: DC

## 2019-11-06 NOTE — Progress Notes (Signed)
We are sorry that you are not feeling well.  Here is how we plan to help!  Based on what you have shared with me it looks like you have sinusitis.  Sinusitis is inflammation and infection in the sinus cavities of the head.  Based on your presentation I believe you most likely have Acute Viral Sinusitis.This is an infection most likely caused by a virus. There is not specific treatment for viral sinusitis other than to help you with the symptoms until the infection runs its course.  You may use an oral decongestant such as Mucinex D or if you have glaucoma or high blood pressure use plain Mucinex. Saline nasal spray help and can safely be used as often as needed for congestion, I have prescribed: Fluticasone nasal spray two sprays in each nostril once a day  Some authorities believe that zinc sprays or the use of Echinacea may shorten the course of your symptoms.  Sinus infections are not as easily transmitted as other respiratory infection, however we still recommend that you avoid close contact with loved ones, especially the very young and elderly.  Remember to wash your hands thoroughly throughout the day as this is the number one way to prevent the spread of infection!  * If you develop a fever or body aches, you may consider covid testing. Below is the information you need for that.  Testing Information: The COVID-19 Community Testing sites will begin testing BY APPOINTMENT ONLY.  You can schedule online at [email or web address]  If you do not have access to a smart phone or computer you may call [phone] for an appointment.  Testing Locations: Appointment schedule is 8 am to 3:30 pm at all sites  [facility] indoors at [address] [place] [address] [facility]  indoors at [place]. [address] [place], [place] [address] [place] indoors at [address] [place] [address]  Additional testing sites in the Community:  . For [facility] Testing sites in [facility]  FaceUpdate.uy  . For Pop-up testing sites in [place]  BowlDirectory.co.uk  . For Testing sites with regular hours https://onsms.org/[place]/  . For [place] RenewablesAnalytics.si  . For [facility] BasicJet.ca  . For [facility] testing in [place] and [employer] BasicJet.ca  . For [facility] testing in [facility]   https://lhi.care/covidtesting  For  more information about community testing call [place]:  Only take medications as instructed by your medical team.  Do not take these medications with alcohol.  A steam or ultrasonic humidifier can help congestion.  You can place a towel over your head and breathe in the steam from hot water coming from a faucet.  Avoid close contacts especially the very young and the elderly.  Cover your mouth when you cough or sneeze.  Always remember to wash your hands.  Get Help Right Away If:  You develop worsening fever or sinus pain.  You develop a severe head ache or visual changes.  Your symptoms persist after you have completed your treatment plan.  Make sure you  Understand these instructions.  Will watch your condition.  Will get help right away if you are not doing well or get worse.  Your e-visit answers were reviewed by a board certified advanced clinical practitioner to complete your personal care plan.  Depending on the condition, your plan could have included both over the counter or prescription medications.  If there is a problem  please reply  once you have received a response from your  provider.  Your safety is important to [place].  If you have drug allergies check your prescription carefully.    You can use MyChart to ask questions about today's visit, request a non-urgent call back, or ask for a work or school excuse for 24 hours related to this e-Visit. If it has been greater than 24 hours you will need to follow up with your provider, or enter a new e-Visit to address those concerns.  You will get an e-mail in the next two days asking about your experience.  I hope that your e-visit has been valuable and will speed your recovery. Thank you for using e-visits.   5-10 minutes spent reviewing and documenting in chart.

## 2020-01-03 ENCOUNTER — Other Ambulatory Visit: Payer: Self-pay

## 2020-01-03 DIAGNOSIS — E039 Hypothyroidism, unspecified: Secondary | ICD-10-CM

## 2020-01-03 MED ORDER — LEVOTHYROXINE SODIUM 50 MCG PO TABS: ORAL_TABLET | ORAL | 1 refills | Status: DC

## 2020-01-07 ENCOUNTER — Other Ambulatory Visit: Payer: Self-pay

## 2020-05-01 ENCOUNTER — Other Ambulatory Visit: Payer: Self-pay | Admitting: Family Medicine

## 2020-05-01 DIAGNOSIS — E039 Hypothyroidism, unspecified: Secondary | ICD-10-CM

## 2020-05-01 NOTE — Telephone Encounter (Signed)
Requested  medications are  due for refill today yes  Requested medications are on the active medication list yes  Last refill 5/18  Last valid visit over 12 months ago  Future visit scheduled no  Notes to clinic Failed protocol due to no valid visit within 12 months without upcoming scheduled appt.

## 2020-05-17 ENCOUNTER — Other Ambulatory Visit: Payer: Self-pay | Admitting: Family Medicine

## 2020-05-17 DIAGNOSIS — E039 Hypothyroidism, unspecified: Secondary | ICD-10-CM

## 2020-05-17 NOTE — Telephone Encounter (Signed)
Requested  medications are  due for refill today yes  Requested medications are on the active medication list yes  Last refill 5/18  Last visit More than 12 months ago  Future visit scheduled NO  Notes to clinic Failed protocol due to no valid visit within 12 months

## 2020-07-21 ENCOUNTER — Telehealth: Payer: No Typology Code available for payment source | Admitting: Physician Assistant

## 2020-07-21 DIAGNOSIS — R509 Fever, unspecified: Secondary | ICD-10-CM

## 2020-07-21 DIAGNOSIS — H9209 Otalgia, unspecified ear: Secondary | ICD-10-CM

## 2020-07-21 NOTE — Progress Notes (Signed)
Based on what you shared with me, I feel your condition warrants further evaluation and I recommend that you be seen for a face to face office visit. If you are having high fevers with this ear pain then you will need to be seen in person for an ear exam   NOTE: If you entered your credit card information for this eVisit, you will not be charged. You may see a "hold" on your card for the $35 but that hold will drop off and you will not have a charge processed.   If you are having a true medical emergency please call 911.      For an urgent face to face visit, [facility] has five urgent care centers for your convenience:      NEW:  [facility] [place] at [place] Get Driving Directions [phone] [place], [place] [address] . 10 am - 6pm Monday - Friday    [facility] [place] [facility]) Get Driving Directions [phone] [address] [place], [place] [address] . 10 am to 8 pm Monday-Friday . 12 pm to 8 pm [facility] at [facility] Get Driving Directions [phone] [place], [place], [place] [address] . 8 am to 8 pm Monday-Friday . 9 am to 6 pm Saturday . 11 am to 6 pm Sunday     [facility] at [facility] Get Driving Directions  [phone] [address] [place], [place] [address] . 8 am to 8 pm Monday-Friday . 8 am to 4 pm [facility] at [place] Get Driving Directions [phone] [place] [place],  [address] . 12 pm to 6 pm Monday-Friday      Your e-visit answers were reviewed by a board certified advanced clinical practitioner to complete your personal care plan.  Thank you for using e-Visits.    Approximately 5 minutes was spent documenting and reviewing patient's chart.

## 2021-01-03 IMAGING — CT CT ABD-PELV W/O CM
1 of 2 series · 14 of 32 positions shown, 19 images · non-contrast
Comparison: None.

CLINICAL DATA: Flank pain. Recurrent urinary stone disease
suspected.

EXAM:
CT ABDOMEN AND PELVIS WITHOUT CONTRAST
TECHNIQUE: Multidetector CT imaging of the abdomen and pelvis was performed
following the standard protocol without IV contrast.

[Series 2: abd/pelvis w/(date) · axial · 0.89mm/px · z∈[-436,-1]mm · 14 of 99 slices shown, 19 images]
[im 6/99  soft-tissue]
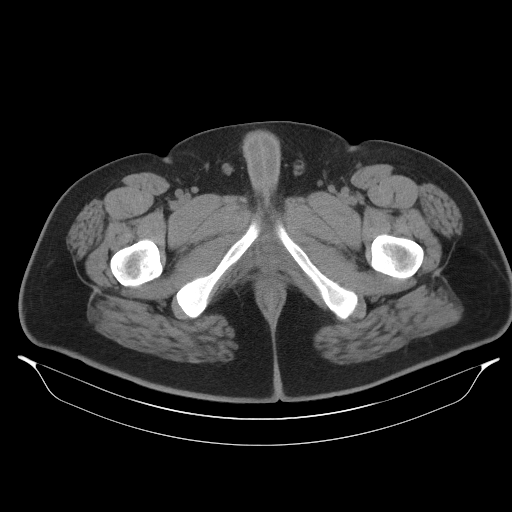
[im 6/99  bone]
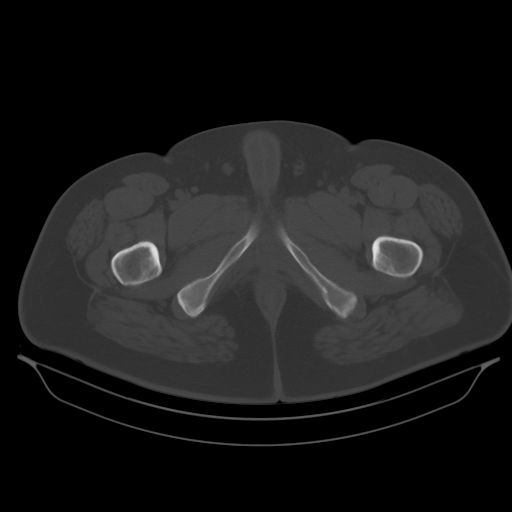
[im 11/99  soft-tissue]
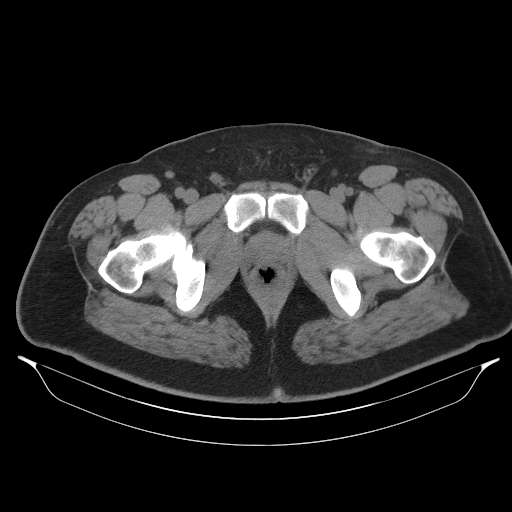
[im 22/99  soft-tissue]
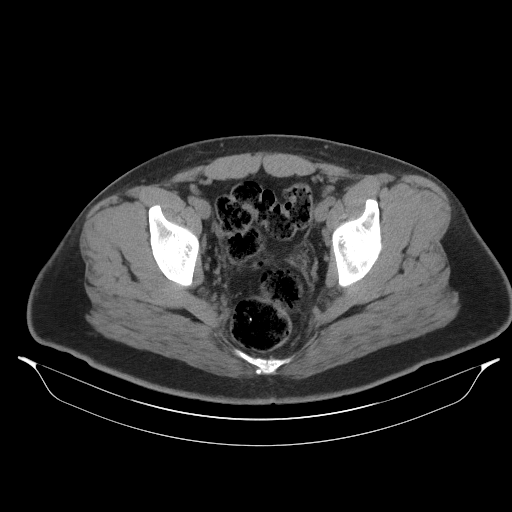
[im 28/99  soft-tissue]
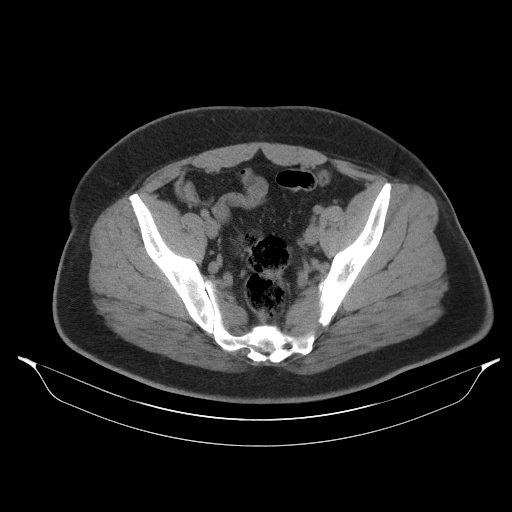
[im 33/99  soft-tissue]
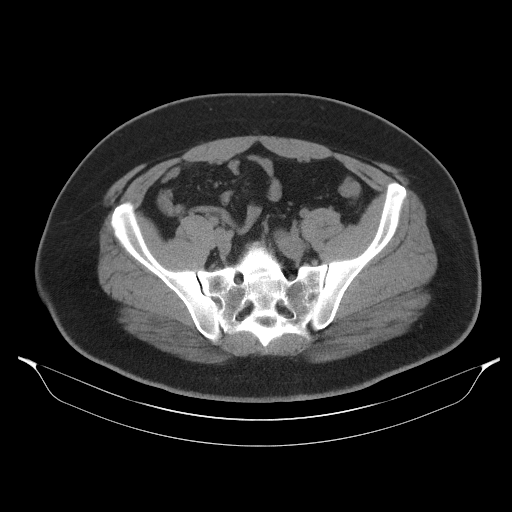
[im 44/99  soft-tissue]
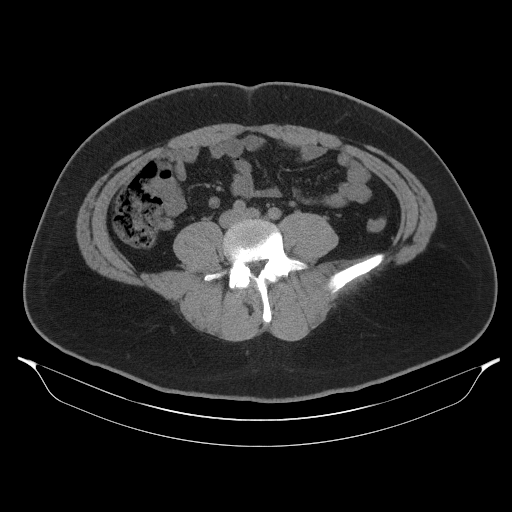
[im 50/99  soft-tissue]
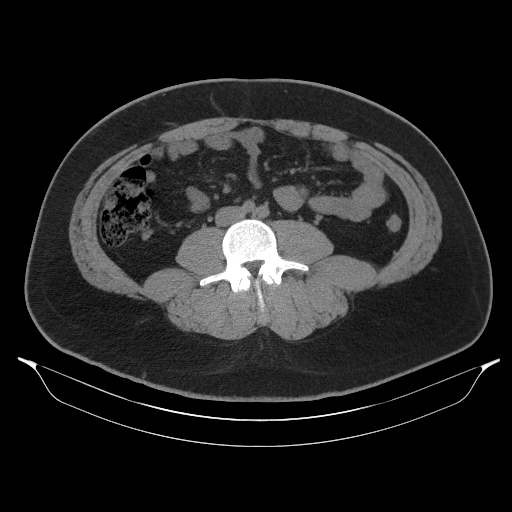
[im 55/99  soft-tissue]
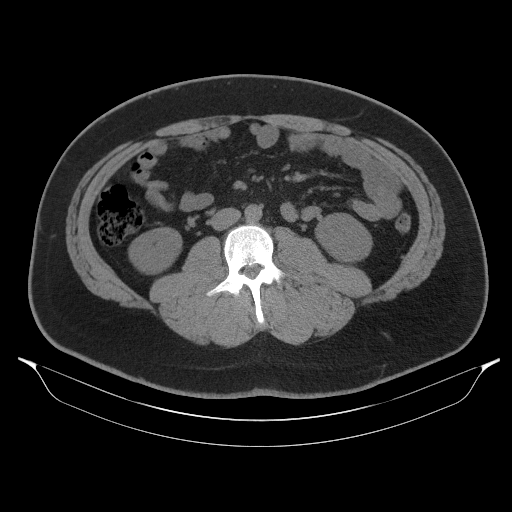
[im 66/99  soft-tissue]
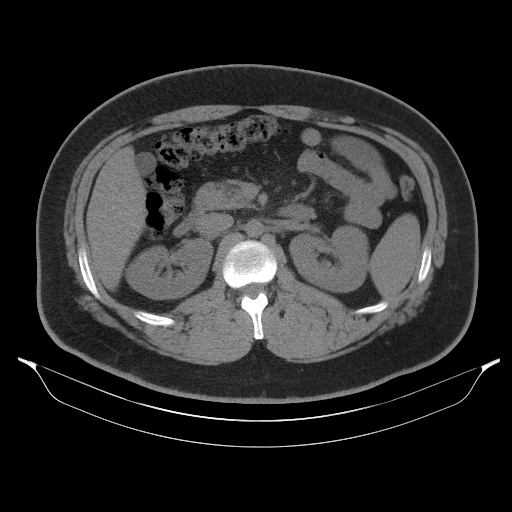
[im 66/99  bone]
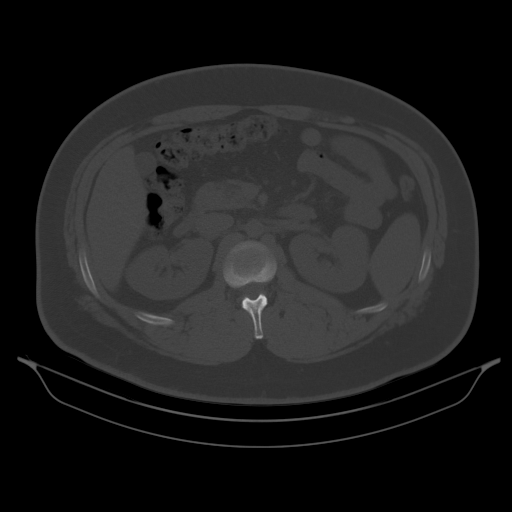
[im 71/99  soft-tissue]
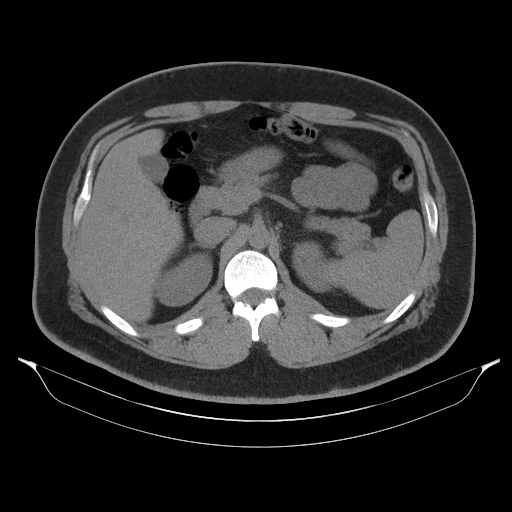
[im 77/99  soft-tissue]
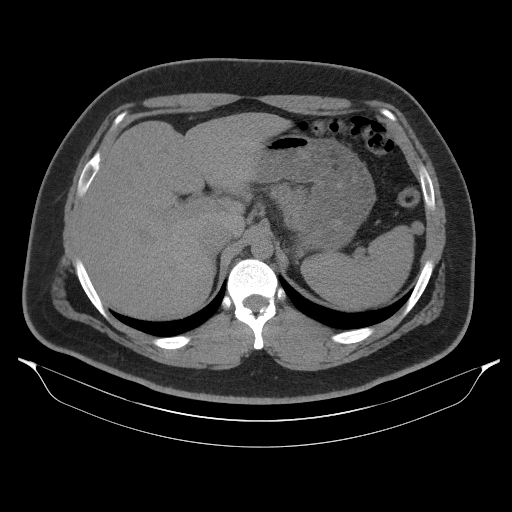
[im 77/99  lung]
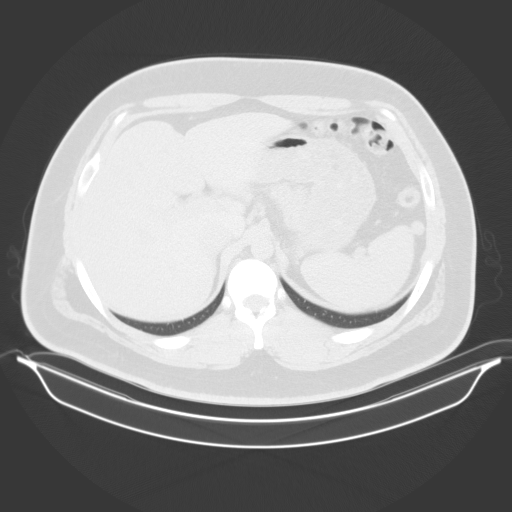
[im 82/99  lung]
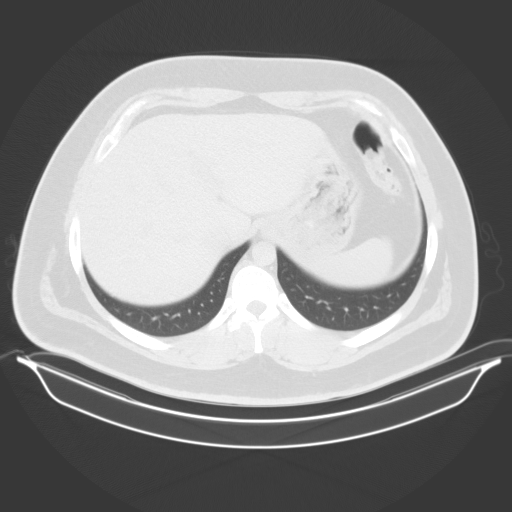
[im 88/99  soft-tissue]
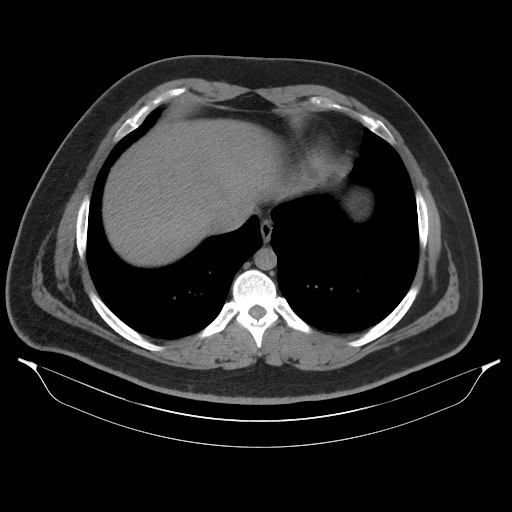
[im 88/99  lung]
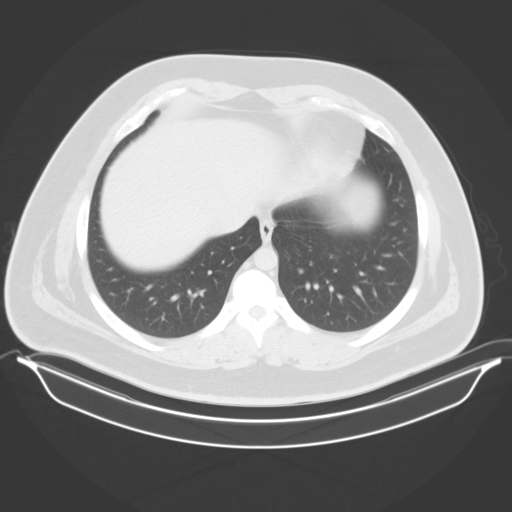
[im 93/99  soft-tissue]
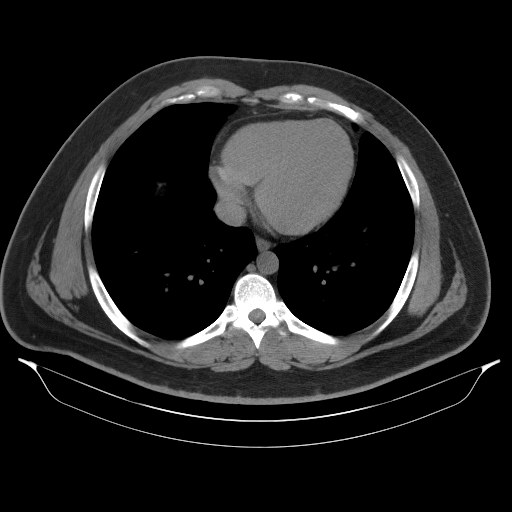
[im 93/99  lung]
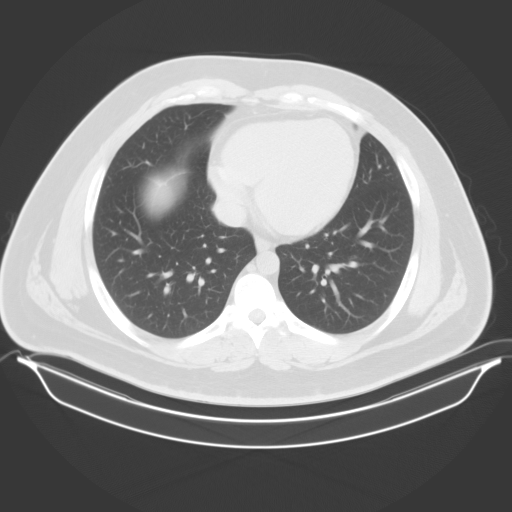

[14 of 32 positions shown; findings below may reference images not displayed]

FINDINGS: Lower chest: Unremarkable

Hepatobiliary: No focal abnormality in the liver on this study
without intravenous contrast. There is no evidence for gallstones,
gallbladder wall thickening, or pericholecystic fluid. No
intrahepatic or extrahepatic biliary dilation.

Pancreas: No focal mass lesion. No dilatation of the main duct. No
intraparenchymal cyst. No peripancreatic edema.

Spleen: No splenomegaly. No focal mass lesion.

Adrenals/Urinary Tract: No adrenal nodule or mass. No stones in
either kidney or ureter. No secondary changes in either kidney or
ureter. No bladder stones.

Stomach/Bowel: Stomach is unremarkable. No gastric wall thickening.
No evidence of outlet obstruction. Duodenum is normally positioned
as is the ligament of Treitz. No small bowel wall thickening. No
small bowel dilatation. The terminal ileum is normal. The appendix
is not visualized, but there is no edema or inflammation in the
region of the cecum. No gross colonic mass. No colonic wall
thickening.

Vascular/Lymphatic: No abdominal aortic aneurysm. No abdominal
aortic atherosclerotic calcification. There is no gastrohepatic or
hepatoduodenal ligament lymphadenopathy. No intraperitoneal or
retroperitoneal lymphadenopathy. No pelvic sidewall lymphadenopathy.

Reproductive: The prostate gland and seminal vesicles are
unremarkable.

Other: No intraperitoneal free fluid.

Musculoskeletal: No worrisome lytic or sclerotic osseous
abnormality. Small sclerotic focus right femoral neck likely bone
island.
IMPRESSION: No acute findings in the abdomen or pelvis. No urinary stone
disease. No secondary changes in either kidney or ureter.

## 2021-10-29 DIAGNOSIS — J301 Allergic rhinitis due to pollen: Secondary | ICD-10-CM | POA: Insufficient documentation

## 2021-10-29 DIAGNOSIS — L501 Idiopathic urticaria: Secondary | ICD-10-CM | POA: Insufficient documentation

## 2021-10-29 DIAGNOSIS — J3081 Allergic rhinitis due to animal (cat) (dog) hair and dander: Secondary | ICD-10-CM | POA: Insufficient documentation

## 2021-10-29 DIAGNOSIS — J4530 Mild persistent asthma, uncomplicated: Secondary | ICD-10-CM | POA: Insufficient documentation

## 2022-06-04 ENCOUNTER — Encounter (HOSPITAL_COMMUNITY): Payer: Self-pay

## 2022-06-04 ENCOUNTER — Emergency Department (HOSPITAL_COMMUNITY)
Admission: EM | Admit: 2022-06-04 | Discharge: 2022-06-04 | Disposition: A | Discharge status: Home / Self Care | Payer: No Typology Code available for payment source | Attending: Emergency Medicine | Admitting: Emergency Medicine

## 2022-06-04 ENCOUNTER — Emergency Department (HOSPITAL_COMMUNITY): Payer: Self-pay | Attending: Emergency Medicine

## 2022-06-04 ENCOUNTER — Other Ambulatory Visit: Payer: Self-pay

## 2022-06-04 DIAGNOSIS — T495X5A Adverse effect of ophthalmological drugs and preparations, initial encounter: Secondary | ICD-10-CM | POA: Diagnosis not present

## 2022-06-04 DIAGNOSIS — R072 Precordial pain: Secondary | ICD-10-CM | POA: Diagnosis not present

## 2022-06-04 DIAGNOSIS — R079 Chest pain, unspecified: Secondary | ICD-10-CM | POA: Diagnosis present

## 2022-06-04 DIAGNOSIS — Z77098 Contact with and (suspected) exposure to other hazardous, chiefly nonmedicinal, chemicals: Secondary | ICD-10-CM

## 2022-06-04 LAB — COMPREHENSIVE METABOLIC PANEL
ALT: 29 U/L (ref 0–44)
AST: 19 U/L (ref 15–41)
Albumin: 4.1 g/dL (ref 3.5–5.0)
Alkaline Phosphatase: 52 U/L (ref 38–126)
Anion gap: 8 (ref 5–15)
BUN: 9 mg/dL (ref 6–20)
CO2: 26 mmol/L (ref 22–32)
Calcium: 9.3 mg/dL (ref 8.9–10.3)
Chloride: 108 mmol/L (ref 98–111)
Creatinine, Ser: 0.7 mg/dL (ref 0.61–1.24)
GFR, Estimated: >60 mL/min (ref >60)
Glucose, Bld: 97 mg/dL (ref 70–99)
Potassium: 3.7 mmol/L (ref 3.5–5.1)
Sodium: 142 mmol/L (ref 135–145)
Total Bilirubin: 1.2 mg/dL (ref 0.3–1.2)
Total Protein: 7.8 g/dL (ref 6.5–8.1)

## 2022-06-04 LAB — CBC
HCT: 43.6 % (ref 39.0–52.0)
Hemoglobin: 14.8 g/dL (ref 13.0–17.0)
MCH: 31 pg (ref 26.0–34.0)
MCHC: 33.9 g/dL (ref 30.0–36.0)
MCV: 91.2 fL (ref 80.0–100.0)
Platelets: 276 10*3/uL (ref 150–400)
RBC: 4.78 MIL/uL (ref 4.22–5.81)
RDW: 12.4 % (ref 11.5–15.5)
WBC: 9.1 10*3/uL (ref 4.0–10.5)
nRBC: 0 % (ref 0.0–0.2)

## 2022-06-04 LAB — TROPONIN I (HIGH SENSITIVITY): Troponin I (High Sensitivity): <2 ng/L (ref <18)

## 2022-06-04 NOTE — Discharge Instructions (Addendum)
It was our pleasure to provide your ER care today - we hope that you feel better.  For chest discomfort, follow up closely with cardiologist in the next 1-2 weeks.   Return to ER if worse, new symptoms, fevers, recurrent/persistent chest pain, increased trouble breathing, or other concern.

## 2022-06-04 NOTE — ED Triage Notes (Signed)
Pt bib ems pt reports chest tightness and right arm "tingling" pt states he spilled Atropine on right arm pta. Symptoms started after atropine incident.

## 2022-06-04 NOTE — ED Provider Notes (Addendum)
[facility] DEPT Provider Note   CSN: [id number] Arrival date & time: 06/04/22  1515     History  Chief Complaint  Patient presents with   Chest Pain    Philip Stephenson is a 26 y.o. male.  Pt c/o feeling  tingling in right fingers/hand, and then transient episodes chest pain after atropine accidentally spilled on right hand. 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 Occurred around 9 AM today. Symptoms at rest. Initially notes recurrent episodes cp each lasting very briefly ?15 seconds. No current or recent exertional chest pain or discomfort. No sob, nv or diaphoresis. No constant and/or pleuritic pain. No leg pain or swelling. No cough. No fever. No neck pain or radicular pain. No current arm numbness/weakness, or loss of normal functional ability. No hx cad.  The history is provided by the patient, medical records and the EMS personnel.  Chest Pain Associated symptoms: numbness   Associated symptoms: no abdominal pain, no back pain, no fever, no headache, no nausea, no palpitations, no shortness of breath and no vomiting        Home Medications Prior to Admission medications   Medication Sig Start Date End Date Taking? Authorizing Provider  albuterol (VENTOLIN HFA) 108 (90 Base) MCG/ACT inhaler Inhale 2 puffs into the lungs every 6 (six) hours as needed for wheezing or shortness of breath. 07/31/19   Hassell Done, Mary-Margaret, FNP  cetirizine (ZYRTEC) 10 MG tablet Take 10 mg by mouth daily.    [provider]  Elastic Bandages & Supports (MEDICAL COMPRESSION SOCKS) MISC Use compression socks when standing for prolonged periods of time > 3 hours.  Purchase socks with 18-25 mmHg pressure.  Consider increasing to 25-35 mmHg if needed. 02/16/18   Mikey College, NP  fluticasone (FLONASE) 50 MCG/ACT nasal spray Place 2 sprays into both nostrils daily. 11/06/19    Hassell Done, Mary-Margaret, FNP  levothyroxine (SYNTHROID) 50 MCG tablet TAKE 1 TABLET(50 MCG) BY MOUTH DAILY BEFORE BREAKFAST. 01/03/20   Malfi, Lupita Raider, FNP  meloxicam (MOBIC) 15 MG tablet TK 1 T PO QD 03/15/19   [provider]  montelukast (SINGULAIR) 10 MG tablet Take 10 mg by mouth at bedtime.    [provider]      Allergies    Patient has no known allergies.    Review of Systems   Review of Systems  Constitutional:  Negative for chills and fever.  HENT:  Negative for sore throat.   Eyes:  Negative for redness.  Respiratory:  Negative for shortness of breath.   Cardiovascular:  Positive for chest pain. Negative for palpitations and leg swelling.  Gastrointestinal:  Negative for abdominal pain, nausea and vomiting.  Genitourinary:  Negative for flank pain.  Musculoskeletal:  Negative for back pain and neck pain.  Skin:  Negative for rash.  Neurological:  Positive for numbness. Negative for headaches.  Hematological:  Does not bruise/bleed easily.  Psychiatric/Behavioral:  Negative for confusion.     Physical Exam Updated Vital Signs BP (!) 146/84   Pulse 63   Temp 98.5 F (36.9 C) (Oral)   Resp 18   Ht 1.702 m ('5\' 7"'$ )   Wt 110.7 kg   SpO2 100%   BMI 38.22 kg/m  Physical Exam Vitals and nursing note reviewed.  Constitutional:      Appearance: Normal appearance. He is well-developed.  HENT:     Head: Atraumatic.  Nose: Nose normal.     Mouth/Throat:     Mouth: Mucous membranes are moist.  Eyes:     General: No scleral icterus.    Conjunctiva/sclera: Conjunctivae normal.  Neck:     Vascular: No carotid bruit.     Trachea: No tracheal deviation.  Cardiovascular:     Rate and Rhythm: Normal rate and regular rhythm.     Pulses: Normal pulses.     Heart sounds: Normal heart sounds. No murmur heard.    No friction rub. No gallop.  Pulmonary:     Effort: Pulmonary effort is normal. No accessory muscle usage or respiratory distress.     Breath  sounds: Normal breath sounds.  Abdominal:     General: There is no distension.     Palpations: Abdomen is soft.     Tenderness: There is no abdominal tenderness.  Genitourinary:    Comments: No cva tenderness. Musculoskeletal:        General: No swelling or tenderness.     Cervical back: Normal range of motion and neck supple. No rigidity.     Right lower leg: No edema.     Left lower leg: No edema.     Comments: No RUE edema. Radial pulse 2+. No swelling. Normal cap refill in digits. C spine non tender, normal rom, no radicular pain.   Skin:    General: Skin is warm and dry.     Findings: No rash.  Neurological:     Mental Status: He is alert.     Comments: Alert, speech clear. RUE nvi w intact motor/sens fxn.   Psychiatric:        Mood and Affect: Mood normal.     ED Results / Procedures / Treatments   Labs (all labs ordered are listed, but only abnormal results are displayed) Results for orders placed or performed during the hospital encounter of 06/04/22  Comprehensive metabolic panel  Result Value Ref Range   Sodium 142 135 - 145 mmol/L   Potassium 3.7 3.5 - 5.1 mmol/L   Chloride 108 98 - 111 mmol/L   CO2 26 22 - 32 mmol/L   Glucose, Bld 97 70 - 99 mg/dL   BUN 9 6 - 20 mg/dL   Creatinine, Ser 0.70 0.61 - 1.24 mg/dL   Calcium 9.3 8.9 - 10.3 mg/dL   Total Protein 7.8 6.5 - 8.1 g/dL   Albumin 4.1 3.5 - 5.0 g/dL   AST 19 15 - 41 U/L   ALT 29 0 - 44 U/L   Alkaline Phosphatase 52 38 - 126 U/L   Total Bilirubin 1.2 0.3 - 1.2 mg/dL   GFR, Estimated >60 >60 mL/min   Anion gap 8 5 - 15  CBC  Result Value Ref Range   WBC 9.1 4.0 - 10.5 K/uL   RBC 4.78 4.22 - 5.81 MIL/uL   Hemoglobin 14.8 13.0 - 17.0 g/dL   HCT 43.6 39.0 - 52.0 %   MCV 91.2 80.0 - 100.0 fL   MCH 31.0 26.0 - 34.0 pg   MCHC 33.9 30.0 - 36.0 g/dL   RDW 12.4 11.5 - 15.5 %   Platelets 276 150 - 400 K/uL   nRBC 0.0 0.0 - 0.2 %  Troponin I (High Sensitivity)  Result Value Ref Range   Troponin I (High  Sensitivity) <2 <18 ng/L     EKG EKG Interpretation  Date/Time:  Friday June 04 2022 18:22:49 EDT Ventricular Rate:  64 PR Interval:  164 QRS Duration:  98 QT Interval:  405 QTC Calculation: 418 R Axis:   91 Text Interpretation: Sinus arrhythmia Non-specific ST-t changes Confirmed by Lajean Saver [phone]) on 06/04/2022 6:56:06 PM  Radiology DG Chest Port 1 View  Result Date: 06/04/2022 CLINICAL DATA:  Chest pain EXAM: PORTABLE CHEST 1 VIEW COMPARISON:  07/12/2018 FINDINGS: There is poor inspiration. Cardiac size is within normal limits. There are no signs of pulmonary edema or focal pulmonary consolidation. There is no pleural effusion or pneumothorax. Patient's chin is partially obscuring the apices. IMPRESSION: No active disease. Electronically Signed   By: Elmer Picker M.D.   On: 06/04/2022 16:46    Procedures Procedures    Medications Ordered in ED Medications - No data to display  ED Course/ Medical Decision Making/ A&P                           Medical Decision Making Problems Addressed: Chemical exposure: acute illness or injury with systemic symptoms that poses a threat to life or bodily functions Precordial chest pain: acute illness or injury with systemic symptoms that poses a threat to life or bodily functions  Amount and/or Complexity of Data Reviewed Independent Historian: EMS    Details: hx External Data Reviewed: notes. Labs: ordered. Decision-making details documented in ED Course. Radiology: ordered and independent interpretation performed. Decision-making details documented in ED Course. ECG/medicine tests: ordered.  Risk Decision regarding hospitalization.  Iv ns. Continuous pulse ox and cardiac monitoring. Labs ordered/sent. Imaging ordered.   Diff dx includes acs, atypical cp, exposure/rxn, etc. - dispo decision including potential need for admission if ECG w acute/worrisome changes or elev trop - will get labs and imaging and reassess.    Reviewed nursing notes and prior charts for additional history. External reports reviewed. Additional history from: EMS.   Cardiac monitor: sinus rhythm, rate 66.  Labs reviewed/interpreted by me - trop normal.   Xrays reviewed/interpreted by me - no pna.   Recheck pt, no chest pain or sob. No numbness/weakness.   Pt appears stable for d/c.   Return precautions provided.            Final Clinical Impression(s) / ED Diagnoses Final diagnoses:  None    Rx / DC Orders ED Discharge Orders     None           Lajean Saver, MD 06/04/22 1900

## 2022-06-08 ENCOUNTER — Encounter: Payer: Self-pay | Admitting: Interventional Cardiology

## 2022-06-08 ENCOUNTER — Ambulatory Visit (INDEPENDENT_AMBULATORY_CARE_PROVIDER_SITE_OTHER): Payer: 59 | Admitting: Interventional Cardiology

## 2022-06-08 VITALS — BP 128/88 | HR 65 | Ht 67.0 in | Wt 245.0 lb

## 2022-06-08 DIAGNOSIS — R0609 Other forms of dyspnea: Secondary | ICD-10-CM | POA: Diagnosis not present

## 2022-06-08 DIAGNOSIS — Z8249 Family history of ischemic heart disease and other diseases of the circulatory system: Secondary | ICD-10-CM | POA: Diagnosis not present

## 2022-06-08 DIAGNOSIS — E669 Obesity, unspecified: Secondary | ICD-10-CM

## 2022-06-08 NOTE — Progress Notes (Signed)
Cardiology Office Note   Date:  06/08/2022   ID:  Philip Stephenson, DOB 10-23-1996, MRN [id number]  PCP:  Mikey College, NP (Inactive)    Chief Complaint  Patient presents with   New Patient (Initial Visit)   DOE  Wt Readings from Last 3 Encounters:  06/08/22 245 lb (111.1 kg)  06/04/22 244 lb (110.7 kg)  08/21/19 239 lb (108.4 kg)       History of Present Illness: Philip Stephenson is a 26 y.o. male who is being seen today for the evaluation of chest pain at the request of Lajean Saver, MD.   Had an exposure at work to atropine on 06/04/22 on his right hand. He had right hand numbness starting within a few minutes.  Lasted five hours- diminished fling, felt swollen Pain was more on the right side of the chest. Throbbing.  Was sent to [facility] and then to ER.  He has had anxiety in the past.    He has had some DOE walking up stairs that he thinks may be new since the exposure.   He has a h/o Raynauds syndrome.      Past Medical History:  Diagnosis Date   Allergy    Asthma     Past Surgical History:  Procedure Laterality Date   APPENDECTOMY     HERNIA REPAIR       Current Outpatient Medications  Medication Sig Dispense Refill   acetaminophen (TYLENOL) 500 MG tablet Take 500 mg by mouth every 6 (six) hours as needed for moderate pain.     albuterol (VENTOLIN HFA) 108 (90 Base) MCG/ACT inhaler Inhale 2 puffs into the lungs every 6 (six) hours as needed for wheezing or shortness of breath. 18 g 0   cetirizine (ZYRTEC) 10 MG tablet Take 10 mg by mouth daily as needed for allergies.     EPINEPHrine 0.3 mg/0.3 mL IJ SOAJ injection auto-injector into outer thigh     propranolol (INDERAL) 20 MG tablet Take 20 mg by mouth once.     No current facility-administered medications for this visit.    Allergies:   Patient has no known allergies.    Social History:  The patient  reports that he has never smoked. He has never used smokeless tobacco. He reports current  alcohol use. He reports that he does not use drugs.   Family History:  The patient's family history includes Atrial fibrillation in his father; Basal cell carcinoma in his mother; Cancer in his mother; Depression in his mother, sister, and sister; Diabetes in his father and paternal grandfather; Heart disease in his father and paternal grandfather; Heart failure in his father; Prostate cancer in his paternal grandfather; Thyroid disease in his father.    ROS:  Please see the history of present illness.   Otherwise, review of systems are positive for recent DOE.   All other systems are reviewed and negative.    PHYSICAL EXAM: VS:  BP 128/88   Pulse 65   Ht '5\' 7"'$  (1.702 m)   Wt 245 lb (111.1 kg)   SpO2 97%   BMI 38.37 kg/m  , BMI Body mass index is 38.37 kg/m. GEN: Well nourished, well developed, in no acute distress HEENT: normal Neck: no JVD, carotid bruits, or masses Cardiac: RRR; no murmurs, rubs, or gallops,no edema  Respiratory:  clear to auscultation bilaterally, normal work of breathing GI: soft, nontender, nondistended, + BS MS: no deformity or atrophy Skin: warm and dry, no rash Neuro:  Strength and sensation are intact Psych: euthymic mood, full affect   EKG:   The ekg ordered today demonstrates NSR, sinus arrhythmia   Recent Labs: 06/04/2022: ALT 29; BUN 9; Creatinine, Ser 0.70; Hemoglobin 14.8; Platelets 276; Potassium 3.7; Sodium 142   Lipid Panel    Component Value Date/Time   CHOL 127 04/12/2017 0832   TRIG 115 04/12/2017 0832   HDL 42 04/12/2017 0832   CHOLHDL 3.0 04/12/2017 0832   LDLCALC 62 04/12/2017 0832     Other studies Reviewed: Additional studies/ records that were reviewed today with results demonstrating: ER records. Normal echo in 8/18..   ASSESSMENT AND PLAN:  DOE: Check an echocardiogram to rule out any structural heart disease.  No signs of congestive heart failure on exam.  We spoke about trying to increase stamina through regular  exercise.  Target below discussed in depth.  We also talked about target heart rates based on his age and goals. Atropine exposure: I don't think ECG finding are related to atropine.  Sinus arrhythmia was present in 2018 as well.   Obesity: We spoke about healthy lifestyle, increasing activity.  Family h/o early CAD in father with stents, likely MI as cause of death. Whole food, plant based diet.  High-fiber diet.  Avoid processed foods.   Current medicines are reviewed at length with the patient today.  The patient concerns regarding his medicines were addressed.  The following changes have been made:  No change  Labs/ tests ordered today include: ER records were reviewed No orders of the defined types were placed in this encounter.   Recommend 150 minutes/week of aerobic exercise Low fat, low carb, high fiber diet recommended  Disposition:   FU as needed   Signed, Larae Grooms, MD  06/08/2022 11:24 AM    [facility] [place], [place], [place]  [address] Phone: [phone]; Fax: [phone]

## 2022-06-08 NOTE — Patient Instructions (Signed)
Medication Instructions:  Your physician recommends that you continue on your current medications as directed. Please refer to the Current Medication list given to you today.  *If you need a refill on your cardiac medications before your next appointment, please call your pharmacy*    Testing/Procedures: ECHO Your physician has requested that you have an echocardiogram. Echocardiography is a painless test that uses sound waves to create images of your heart. It provides your doctor with information about the size and shape of your heart and how well your heart's chambers and valves are working. This procedure takes approximately one hour. There are no restrictions for this procedure.    Follow-Up: At [facility], you and your health needs are our priority.  As part of our continuing mission to provide you with exceptional heart care, we have created designated Provider Care Teams.  These Care Teams include your primary Cardiologist (physician) and Advanced Practice Providers (APPs -  Physician Assistants and Nurse Practitioners) who all work together to provide you with the care you need, when you need it.  We recommend signing up for the patient portal called "MyChart".  Sign up information is provided on this After Visit Summary.  MyChart is used to connect with patients for Virtual Visits (Telemedicine).  Patients are able to view lab/test results, encounter notes, upcoming appointments, etc.  Non-urgent messages can be sent to your provider as well.   To learn more about what you can do with MyChart, go to [email or web address].    Your next appointment:   PRN  The format for your next appointment:   In Person  Provider:   Dr. Irish Lack

## 2022-06-17 ENCOUNTER — Ambulatory Visit (HOSPITAL_COMMUNITY): Payer: 59 | Attending: Cardiology

## 2022-06-17 DIAGNOSIS — R0609 Other forms of dyspnea: Secondary | ICD-10-CM | POA: Diagnosis present

## 2022-06-17 LAB — ECHOCARDIOGRAM COMPLETE
AV Mean grad: 3 mmHg
AV Peak grad: 5.3 mmHg
Ao pk vel: 1.15 m/s
Area-P 1/2: 4.24 cm2
S' Lateral: 3.6 cm

## 2022-09-08 ENCOUNTER — Telehealth: Payer: 59 | Admitting: Physician Assistant

## 2022-09-08 DIAGNOSIS — L03211 Cellulitis of face: Secondary | ICD-10-CM

## 2022-09-08 MED ORDER — CEPHALEXIN 500 MG PO CAPS: 500.0000 mg | ORAL_CAPSULE | Freq: Four times a day (QID) | ORAL | 0 refills | Status: DC

## 2022-09-08 NOTE — Progress Notes (Signed)
E Visit for Cellulitis  We are sorry that you are not feeling well. Here is how we plan to help!  Based on what you shared with me it looks like you have cellulitis.  Cellulitis looks like areas of skin redness, swelling, and warmth; it develops as a result of bacteria entering under the skin. Little red spots and/or bleeding can be seen in skin, and tiny surface sacs containing fluid can occur. Fever can be present. Cellulitis is almost always on one side of a body, and the lower limbs are the most common site of involvement.   I have prescribed:  Keflex 592m take one by mouth four times a day for 5 days  HOME CARE:  Take your medications as ordered and take all of them, even if the skin irritation appears to be healing.   GET HELP RIGHT AWAY IF:  Symptoms that don't begin to go away within 48 hours. Severe redness persists or worsens If the area turns color, spreads or swells. If it blisters and opens, develops yellow-brown crust or bleeds. You develop a fever or chills. If the pain increases or becomes unbearable.  Are unable to keep fluids and food down.  MAKE SURE YOU   Understand these instructions. Will watch your condition. Will get help right away if you are not doing well or get worse.  Thank you for choosing an e-visit.  Your e-visit answers were reviewed by a board certified advanced clinical practitioner to complete your personal care plan. Depending upon the condition, your plan could have included both over the counter or prescription medications.  Please review your pharmacy choice. Make sure the pharmacy is open so you can pick up prescription now. If there is a problem, you may contact your provider through [employer] have the prescription routed to another pharmacy.  Your safety is important to [place] If you have drug allergies check your prescription carefully.   For the next 24 hours you can use MyChart to ask questions about today's visit, request a  non-urgent call back, or ask for a work or school excuse. You will get an email in the next two days asking about your experience. I hope that your e-visit has been valuable and will speed your recovery.  I have spent 5 minutes in review of e-visit questionnaire, review and updating patient chart, medical decision making and response to patient.   JMar Daring PA-C

## 2022-09-30 ENCOUNTER — Encounter: Payer: Self-pay | Admitting: Family Medicine

## 2022-09-30 ENCOUNTER — Ambulatory Visit (INDEPENDENT_AMBULATORY_CARE_PROVIDER_SITE_OTHER): Payer: 59 | Admitting: Family Medicine

## 2022-09-30 VITALS — BP 134/82 | HR 100 | Temp 97.6°F | Ht 67.0 in | Wt 249.0 lb

## 2022-09-30 DIAGNOSIS — E039 Hypothyroidism, unspecified: Secondary | ICD-10-CM

## 2022-09-30 DIAGNOSIS — R631 Polydipsia: Secondary | ICD-10-CM | POA: Diagnosis not present

## 2022-09-30 DIAGNOSIS — Z833 Family history of diabetes mellitus: Secondary | ICD-10-CM | POA: Diagnosis not present

## 2022-09-30 DIAGNOSIS — Z23 Encounter for immunization: Secondary | ICD-10-CM | POA: Diagnosis not present

## 2022-09-30 DIAGNOSIS — J4530 Mild persistent asthma, uncomplicated: Secondary | ICD-10-CM

## 2022-09-30 DIAGNOSIS — E669 Obesity, unspecified: Secondary | ICD-10-CM

## 2022-09-30 LAB — CBC WITH DIFFERENTIAL/PLATELET
Basophils Absolute: 0.1 10*3/uL (ref 0.0–0.1)
Basophils Relative: 0.8 % (ref 0.0–3.0)
Eosinophils Absolute: 0.3 10*3/uL (ref 0.0–0.7)
Eosinophils Relative: 3.4 % (ref 0.0–5.0)
HCT: 43.3 % (ref 39.0–52.0)
Hemoglobin: 14.5 g/dL (ref 13.0–17.0)
Lymphocytes Relative: 25.2 % (ref 12.0–46.0)
Lymphs Abs: 2.2 10*3/uL (ref 0.7–4.0)
MCHC: 33.4 g/dL (ref 30.0–36.0)
MCV: 90.9 fl (ref 78.0–100.0)
Monocytes Absolute: 0.6 10*3/uL (ref 0.1–1.0)
Monocytes Relative: 6.6 % (ref 3.0–12.0)
Neutro Abs: 5.7 10*3/uL (ref 1.4–7.7)
Neutrophils Relative %: 64 % (ref 43.0–77.0)
Platelets: 289 10*3/uL (ref 150.0–400.0)
RBC: 4.77 Mil/uL (ref 4.22–5.81)
RDW: 13 % (ref 11.5–15.5)
WBC: 8.9 10*3/uL (ref 4.0–10.5)

## 2022-09-30 LAB — COMPREHENSIVE METABOLIC PANEL
ALT: 32 U/L (ref 0–53)
AST: 22 U/L (ref 0–37)
Albumin: 4.4 g/dL (ref 3.5–5.2)
Alkaline Phosphatase: 54 U/L (ref 39–117)
BUN: 12 mg/dL (ref 6–23)
CO2: 31 mEq/L (ref 19–32)
Calcium: 9.2 mg/dL (ref 8.4–10.5)
Chloride: 103 mEq/L (ref 96–112)
Creatinine, Ser: 0.87 mg/dL (ref 0.40–1.50)
GFR: 118.14 mL/min (ref >60.00)
Glucose, Bld: 98 mg/dL (ref 70–99)
Potassium: 4.1 mEq/L (ref 3.5–5.1)
Sodium: 140 mEq/L (ref 135–145)
Total Bilirubin: 1 mg/dL (ref 0.2–1.2)
Total Protein: 7.6 g/dL (ref 6.0–8.3)

## 2022-09-30 LAB — HEMOGLOBIN A1C: Hgb A1c MFr Bld: 5.5 % (ref 4.6–6.5)

## 2022-09-30 LAB — T4, FREE: Free T4: 0.91 ng/dL (ref 0.60–1.60)

## 2022-09-30 LAB — TSH: TSH: 1.28 u[IU]/mL (ref 0.35–5.50)

## 2022-09-30 NOTE — Patient Instructions (Signed)
It was a pleasure meeting you today.  Thank you for trusting [place] with your health care.  Please go downstairs for labs before you leave today.  I recommend cutting back on carbohydrates including bread, potatoes, pasta, rice and sugar.  I also recommend getting at least 150 minutes of vigorous physical activity each week.  We will be in touch with your results and any recommendations.

## 2022-09-30 NOTE — Progress Notes (Signed)
New Patient Office Visit  Subjective    Patient ID: Philip Stephenson, male    DOB: 1995/11/06  Age: 26 y.o. MRN: [id number]  CC:  Chief Complaint  Patient presents with   Establish Care    No PCP since 2019, no concerns    HPI Philip Stephenson presents to establish care Previous PCP- not since 2019 in [place].    Other providers:  [facility] for asthma.   Hypothyroidism, mild in the past. No medication in the past couple of years.   Increased thirst and concerned about possibly having diabetes.  States he has been obese his whole life basically.    DM in the family on both sides. Sister recently diagnosed with prediabetes.  Family hx of HTN, CHF, A-fib in father. Died at age 62 with ?MI.    Hx of urticaria and anaphylaxis from black mold. Has an Epi-pen  Hx of head injury from a basketball goal years ago. This happened in [place].  Concussion and laceration. No skull fracture.   Works as Teacher, music, a Environmental education officer.  In relationship. No kids  No smoking. Rare alcohol    Outpatient Encounter Medications as of 09/30/2022  Medication Sig   albuterol (VENTOLIN HFA) 108 (90 Base) MCG/ACT inhaler Inhale 2 puffs into the lungs every 6 (six) hours as needed for wheezing or shortness of breath.   cetirizine (ZYRTEC) 10 MG tablet Take 10 mg by mouth daily as needed for allergies.   EPINEPHrine 0.3 mg/0.3 mL IJ SOAJ injection auto-injector into outer thigh   [DISCONTINUED] acetaminophen (TYLENOL) 500 MG tablet Take 500 mg by mouth every 6 (six) hours as needed for moderate pain.   [DISCONTINUED] cephALEXin (KEFLEX) 500 MG capsule Take 1 capsule (500 mg total) by mouth 4 (four) times daily.   [DISCONTINUED] propranolol (INDERAL) 20 MG tablet Take 20 mg by mouth once.   No facility-administered encounter medications on file as of 09/30/2022.    Past Medical History:  Diagnosis Date   Allergy    Asthma     Past Surgical History:  Procedure Laterality  Date   APPENDECTOMY     HERNIA REPAIR      Family History  Problem Relation Age of Onset   Basal cell carcinoma Mother    Depression Mother    Cancer Mother        basal cell carcinoma nose   Diabetes Father    Thyroid disease Father    Atrial fibrillation Father    Heart failure Father    Heart disease Father    Depression Sister    Depression Sister    Heart disease Paternal Grandfather    Prostate cancer Paternal Grandfather    Diabetes Paternal Grandfather     Social History   Socioeconomic History   Marital status: Single    Spouse name: Not on file   Number of children: Not on file   Years of education: Not on file   Highest education level: Not on file  Occupational History   Not on file  Tobacco Use   Smoking status: Never   Smokeless tobacco: Never  Vaping Use   Vaping Use: Never used  Substance and Sexual Activity   Alcohol use: Yes    Comment: rarely   Drug use: No   Sexual activity: Yes  Other Topics Concern   Not on file  Social History Narrative   Not on file   Social Determinants of Health   Financial Resource Strain: Not  on file  Food Insecurity: Not on file  Transportation Needs: Not on file  Physical Activity: Not on file  Stress: Not on file  Social Connections: Not on file  Intimate Partner Violence: Not on file    ROS      Objective    BP 134/82 (BP Location: Left Arm, Patient Position: Sitting, Cuff Size: Large)   Pulse 100   Temp 97.6 F (36.4 C) (Temporal)   Ht '5\' 7"'$  (1.702 m)   Wt 249 lb (112.9 kg)   SpO2 99%   BMI 39.00 kg/m   Physical Exam Constitutional:      General: He is not in acute distress.    Appearance: He is not ill-appearing.  Cardiovascular:     Rate and Rhythm: Normal rate and regular rhythm.  Pulmonary:     Effort: Pulmonary effort is normal.     Breath sounds: Normal breath sounds.  Skin:    General: Skin is warm and dry.  Neurological:     General: No focal deficit present.     Mental  Status: He is alert and oriented to person, place, and time.  Psychiatric:        Mood and Affect: Mood normal.        Behavior: Behavior normal.        Thought Content: Thought content normal.         Assessment & Plan:   Problem List Items Addressed This Visit       Respiratory   Mild persistent asthma without complication - Primary     Endocrine   Acquired hypothyroidism   Relevant Orders   TSH (Completed)   T4, free (Completed)   Other Visit Diagnoses     Obesity (BMI 30-39.9)       Relevant Orders   CBC with Differential/Platelet (Completed)   Comprehensive metabolic panel (Completed)   Hemoglobin A1c (Completed)   Family history of diabetes mellitus (DM)       Relevant Orders   Hemoglobin A1c (Completed)   Increased thirst       Relevant Orders   CBC with Differential/Platelet (Completed)   Comprehensive metabolic panel (Completed)   Hemoglobin A1c (Completed)   Need for influenza vaccination       Relevant Orders   Flu Vaccine QUAD 48moIM (Fluarix, Fluzone & Alfiuria Quad PF) (Completed)      He is new to the practice and here to establish care.  Check labs. Recommend low sugar, low carbohydrate diet.  No thyroid medication recently.  Follow up pending lab results.    Return for pending labs.   VHarland Dingwall NP-C

## 2023-02-23 ENCOUNTER — Ambulatory Visit (INDEPENDENT_AMBULATORY_CARE_PROVIDER_SITE_OTHER): Payer: 59 | Admitting: Family Medicine

## 2023-02-23 VITALS — BP 122/68 | HR 90 | Temp 97.8°F | Ht 67.0 in | Wt 246.0 lb

## 2023-02-23 DIAGNOSIS — G43009 Migraine without aura, not intractable, without status migrainosus: Secondary | ICD-10-CM

## 2023-02-23 DIAGNOSIS — F419 Anxiety disorder, unspecified: Secondary | ICD-10-CM | POA: Diagnosis not present

## 2023-02-23 MED ORDER — PAROXETINE HCL 20 MG PO TABS: 20.0000 mg | ORAL_TABLET | Freq: Every day | ORAL | 1 refills | Status: DC

## 2023-02-23 MED ORDER — PROPRANOLOL HCL 20 MG PO TABS: 20.0000 mg | ORAL_TABLET | Freq: Every day | ORAL | 1 refills | Status: DC

## 2023-02-23 NOTE — Progress Notes (Signed)
Subjective:     Patient ID: Philip Stephenson, male    DOB: 05/19/1996, 27 y.o.   MRN: [id number]  Chief Complaint  Patient presents with   Anxiety    Nov 2022 was treated for anxiety but medication helped with migraines but since stopped he has been dealing with both of them again (stopped taking in 2023)   Migraine    Constant migraines on and off    HPI Patient is in today for concerns for anxiety and migraine headaches.  No headache today  He was seeing a psychiatrist online in 2022 for anxiety. He was taking Paxil but ran out and would to restart. He was taking 30 mg when he stopped taking it the end of 2023.   States he just graduated and is starting a new job. No depression. Restless and difficulty shutting his brain down to sleep. Works nights.   Headaches since HS. Becoming more frequent. Severe headaches once per month. He has milder headaches every week.  Pain is unilateral and sharp initially then becomes more constant.  Last one was one month ago.  Blurry vision, nausea, photosensitivity. No aura.   He was taking Inderal 20 mg for anxiety and headaches and ran out recently. . Requests refill.   Takes Excedrin and then goes to sleep.         02/23/2023    4:01 PM 09/30/2022    2:59 PM 09/29/2018   10:01 AM 04/05/2017    9:23 AM  Depression screen PHQ 2/9  Decreased Interest 0 0 0 0  Down, Depressed, Hopeless 0 0 0 0  PHQ - 2 Score 0 0 0 0  Altered sleeping  1  0  Tired, decreased energy  0  0  Change in appetite  0  0  Feeling bad or failure about yourself   0  0  Trouble concentrating  0  0  Moving slowly or fidgety/restless  0  0  Suicidal thoughts  0  0  PHQ-9 Score  1  0  Difficult doing work/chores  Not difficult at all       There are no preventive care reminders to display for this patient.  Past Medical History:  Diagnosis Date   Allergy    Asthma     Past Surgical History:  Procedure Laterality Date   APPENDECTOMY     HERNIA REPAIR       Family History  Problem Relation Age of Onset   Basal cell carcinoma Mother    Depression Mother    Cancer Mother        basal cell carcinoma nose   Diabetes Father    Thyroid disease Father    Atrial fibrillation Father    Heart failure Father    Heart disease Father    Depression Sister    Depression Sister    Heart disease Paternal Grandfather    Prostate cancer Paternal Grandfather    Diabetes Paternal Grandfather     Social History   Socioeconomic History   Marital status: Single    Spouse name: Not on file   Number of children: Not on file   Years of education: Not on file   Highest education level: Bachelor's degree (e.g., BA, AB, BS)  Occupational History   Not on file  Tobacco Use   Smoking status: Never   Smokeless tobacco: Never  Vaping Use   Vaping Use: Never used  Substance and Sexual Activity   Alcohol use: Yes  Comment: rarely   Drug use: No   Sexual activity: Yes  Other Topics Concern   Not on file  Social History Narrative   Not on file   Social Determinants of Health   Financial Resource Strain: Low Risk  (02/23/2023)   Overall Financial Resource Strain (CARDIA)    Difficulty of Paying Living Expenses: Not hard at all  Food Insecurity: No Food Insecurity (02/23/2023)   Hunger Vital Sign    Worried About Running Out of Food in the Last Year: Never true    Ran Out of Food in the Last Year: Never true  Transportation Needs: No Transportation Needs (02/23/2023)   PRAPARE - Administrator, Civil Service (Medical): No    Lack of Transportation (Non-Medical): No  Physical Activity: Insufficiently Active (02/23/2023)   Exercise Vital Sign    Days of Exercise per Week: 3 days    Minutes of Exercise per Session: 30 min  Stress: Stress Concern Present (02/23/2023)   [employer] of Occupational Health - Occupational Stress Questionnaire    Feeling of Stress : Very much  Social Connections: Moderately Isolated (02/23/2023)   Social  Connection and Isolation Panel [NHANES]    Frequency of Communication with Friends and Family: Twice a week    Frequency of Social Gatherings with Friends and Family: Once a week    Attends Religious Services: Never    Database administrator or Organizations: No    Attends Engineer, structural: Not on file    Marital Status: Living with partner  Intimate Partner Violence: Not on file    Outpatient Medications Prior to Visit  Medication Sig Dispense Refill   albuterol (VENTOLIN HFA) 108 (90 Base) MCG/ACT inhaler Inhale 2 puffs into the lungs every 6 (six) hours as needed for wheezing or shortness of breath. 18 g 0   cetirizine (ZYRTEC) 10 MG tablet Take 10 mg by mouth daily as needed for allergies.     EPINEPHrine 0.3 mg/0.3 mL IJ SOAJ injection auto-injector into outer thigh     No facility-administered medications prior to visit.    No Known Allergies  Review of Systems  Constitutional:  Negative for chills, fever and malaise/fatigue.  HENT:  Negative for congestion, ear pain and sinus pain.   Eyes:  Negative for blurred vision and double vision.  Respiratory:  Negative for cough and shortness of breath.   Cardiovascular:  Negative for chest pain, palpitations and leg swelling.  Gastrointestinal:  Negative for abdominal pain, constipation, diarrhea, nausea and vomiting.  Genitourinary:  Negative for dysuria, frequency and urgency.  Neurological:  Positive for headaches. Negative for dizziness and focal weakness.  Psychiatric/Behavioral:  Negative for depression and suicidal ideas. The patient is nervous/anxious and has insomnia.        Objective:    Physical Exam Constitutional:      General: He is not in acute distress.    Appearance: He is not ill-appearing.  Eyes:     Extraocular Movements: Extraocular movements intact.     Conjunctiva/sclera: Conjunctivae normal.  Cardiovascular:     Rate and Rhythm: Normal rate.  Pulmonary:     Effort: Pulmonary effort is  normal.  Musculoskeletal:     Cervical back: Normal range of motion and neck supple.  Skin:    General: Skin is warm and dry.  Neurological:     General: No focal deficit present.     Mental Status: He is alert and oriented to person, place, and time.  Psychiatric:        Mood and Affect: Mood normal.        Behavior: Behavior normal.        Thought Content: Thought content normal.     BP 122/68 (BP Location: Left Arm, Patient Position: Sitting, Cuff Size: Large)   Pulse 90   Temp 97.8 F (36.6 C) (Temporal)   Ht 5\' 7"  (1.702 m)   Wt 246 lb (111.6 kg)   SpO2 99%   BMI 38.53 kg/m  Wt Readings from Last 3 Encounters:  02/23/23 246 lb (111.6 kg)  09/30/22 249 lb (112.9 kg)  06/08/22 245 lb (111.1 kg)       Assessment & Plan:   Problem List Items Addressed This Visit   None Visit Diagnoses     Anxiety    -  Primary   Relevant Medications   propranolol (INDERAL) 20 MG tablet   PARoxetine (PAXIL) 20 MG tablet   Migraine without aura and without status migrainosus, not intractable       Relevant Medications   propranolol (INDERAL) 20 MG tablet   PARoxetine (PAXIL) 20 MG tablet      I will restart his medications he was taking for anxiety and headaches. He will follow up if not improving or in 4 wks. Discussed hydrating, health diet, regular sleep and looking for triggers.   I am having Philip Stephenson start on propranolol and PARoxetine. I am also having him maintain his albuterol, cetirizine, and EPINEPHrine.  Meds ordered this encounter  Medications   propranolol (INDERAL) 20 MG tablet    Sig: Take 1 tablet (20 mg total) by mouth daily.    Dispense:  30 tablet    Refill:  1    Order Specific Question:   Supervising Provider    Answer:   Hillard Danker A [[id number]]   PARoxetine (PAXIL) 20 MG tablet    Sig: Take 1 tablet (20 mg total) by mouth daily.    Dispense:  30 tablet    Refill:  1    Order Specific Question:   Supervising Provider    Answer:    Hillard Danker A [[id number]]

## 2023-03-18 ENCOUNTER — Other Ambulatory Visit: Payer: Self-pay | Admitting: Family Medicine

## 2023-03-18 DIAGNOSIS — G43009 Migraine without aura, not intractable, without status migrainosus: Secondary | ICD-10-CM

## 2023-03-18 DIAGNOSIS — F419 Anxiety disorder, unspecified: Secondary | ICD-10-CM

## 2023-03-23 ENCOUNTER — Encounter: Payer: Self-pay | Admitting: Family Medicine

## 2023-03-23 ENCOUNTER — Ambulatory Visit (INDEPENDENT_AMBULATORY_CARE_PROVIDER_SITE_OTHER): Payer: 59 | Admitting: Family Medicine

## 2023-03-23 VITALS — BP 124/84 | HR 60 | Temp 97.6°F | Ht 67.0 in

## 2023-03-23 DIAGNOSIS — G43009 Migraine without aura, not intractable, without status migrainosus: Secondary | ICD-10-CM | POA: Diagnosis not present

## 2023-03-23 DIAGNOSIS — F419 Anxiety disorder, unspecified: Secondary | ICD-10-CM

## 2023-03-23 NOTE — Progress Notes (Signed)
Subjective:     Patient ID: Philip Stephenson, male    DOB: 1996/05/22, 27 y.o.   MRN: [id number]  Chief Complaint  Patient presents with   Anxiety    F/u after starting Paxil and propranolol for anxiety and headaches. States he has been doing good, no migraines.    Anxiety Patient reports no chest pain, dizziness, nausea, palpitations, shortness of breath or suicidal ideas.     Patient is in today for 4 wk f/u on anxiety and headaches. States he has only had 2 mild headaches since starting back on medication. Anxiety is much better. No side effects. Feels well.      Health Maintenance Due  Topic Date Due   COVID-19 Vaccine (6 - 2023-24 season) 06/25/2022    Past Medical History:  Diagnosis Date   Allergy    Asthma     Past Surgical History:  Procedure Laterality Date   APPENDECTOMY     HERNIA REPAIR      Family History  Problem Relation Age of Onset   Basal cell carcinoma Mother    Depression Mother    Cancer Mother        basal cell carcinoma nose   Diabetes Father    Thyroid disease Father    Atrial fibrillation Father    Heart failure Father    Heart disease Father    Depression Sister    Depression Sister    Heart disease Paternal Grandfather    Prostate cancer Paternal Grandfather    Diabetes Paternal Grandfather     Social History   Socioeconomic History   Marital status: Single    Spouse name: Not on file   Number of children: Not on file   Years of education: Not on file   Highest education level: Bachelor's degree (e.g., BA, AB, BS)  Occupational History   Not on file  Tobacco Use   Smoking status: Never   Smokeless tobacco: Never  Vaping Use   Vaping Use: Never used  Substance and Sexual Activity   Alcohol use: Yes    Comment: rarely   Drug use: No   Sexual activity: Yes  Other Topics Concern   Not on file  Social History Narrative   Not on file   Social Determinants of Health   Financial Resource Strain: Low Risk   (02/23/2023)   Overall Financial Resource Strain (CARDIA)    Difficulty of Paying Living Expenses: Not hard at all  Food Insecurity: No Food Insecurity (02/23/2023)   Hunger Vital Sign    Worried About Running Out of Food in the Last Year: Never true    Ran Out of Food in the Last Year: Never true  Transportation Needs: No Transportation Needs (02/23/2023)   PRAPARE - Administrator, Civil Service (Medical): No    Lack of Transportation (Non-Medical): No  Physical Activity: Insufficiently Active (02/23/2023)   Exercise Vital Sign    Days of Exercise per Week: 3 days    Minutes of Exercise per Session: 30 min  Stress: Stress Concern Present (02/23/2023)   [employer] of Occupational Health - Occupational Stress Questionnaire    Feeling of Stress : Very much  Social Connections: Moderately Isolated (02/23/2023)   Social Connection and Isolation Panel [NHANES]    Frequency of Communication with Friends and Family: Twice a week    Frequency of Social Gatherings with Friends and Family: Once a week    Attends Religious Services: Never    Active Member  of Clubs or Organizations: No    Attends Engineer, structural: Not on file    Marital Status: Living with partner  Intimate Partner Violence: Not on file    Outpatient Medications Prior to Visit  Medication Sig Dispense Refill   albuterol (VENTOLIN HFA) 108 (90 Base) MCG/ACT inhaler Inhale 2 puffs into the lungs every 6 (six) hours as needed for wheezing or shortness of breath. 18 g 0   cetirizine (ZYRTEC) 10 MG tablet Take 10 mg by mouth daily as needed for allergies.     EPINEPHrine 0.3 mg/0.3 mL IJ SOAJ injection auto-injector into outer thigh     PARoxetine (PAXIL) 20 MG tablet Take 1 tablet (20 mg total) by mouth daily. 30 tablet 1   propranolol (INDERAL) 20 MG tablet TAKE 1 TABLET BY MOUTH EVERY DAY 90 tablet 1   No facility-administered medications prior to visit.    No Known Allergies  Review of Systems   Constitutional:  Negative for chills and fever.  Respiratory:  Negative for shortness of breath.   Cardiovascular:  Negative for chest pain and palpitations.  Gastrointestinal:  Negative for abdominal pain, diarrhea, nausea and vomiting.  Neurological:  Negative for dizziness.  Psychiatric/Behavioral:  Negative for suicidal ideas.        Objective:    Physical Exam Constitutional:      General: He is not in acute distress.    Appearance: He is not ill-appearing.  Eyes:     Extraocular Movements: Extraocular movements intact.     Conjunctiva/sclera: Conjunctivae normal.  Cardiovascular:     Rate and Rhythm: Normal rate.  Pulmonary:     Effort: Pulmonary effort is normal.  Musculoskeletal:     Cervical back: Normal range of motion and neck supple.  Skin:    General: Skin is dry.  Neurological:     General: No focal deficit present.     Mental Status: He is alert and oriented to person, place, and time.  Psychiatric:        Mood and Affect: Mood normal.        Behavior: Behavior normal.        Thought Content: Thought content normal.     BP 124/84 (BP Location: Left Arm, Patient Position: Sitting, Cuff Size: Large)   Pulse 60   Temp 97.6 F (36.4 C) (Temporal)   Ht 5\' 7"  (1.702 m)   SpO2 98%   BMI 38.53 kg/m  Wt Readings from Last 3 Encounters:  02/23/23 246 lb (111.6 kg)  09/30/22 249 lb (112.9 kg)  06/08/22 245 lb (111.1 kg)       Assessment & Plan:   Problem List Items Addressed This Visit       Cardiovascular and Mediastinum   Migraine without aura and without status migrainosus, not intractable     Other   Anxiety - Primary   He is doing well. Significant reduction in headache frequency and severity.  Continue Paxil and propranolol.  Follow up in 6 months or sooner if needed.   I am having Nishant Howorth maintain his albuterol, cetirizine, EPINEPHrine, PARoxetine, and propranolol.  No orders of the defined types were placed in this  encounter.

## 2023-03-23 NOTE — Patient Instructions (Signed)
Continue your current medications. Let me know when you need refills.

## 2023-04-24 ENCOUNTER — Other Ambulatory Visit: Payer: Self-pay | Admitting: Family Medicine

## 2023-04-24 DIAGNOSIS — F419 Anxiety disorder, unspecified: Secondary | ICD-10-CM

## 2023-04-24 DIAGNOSIS — G43009 Migraine without aura, not intractable, without status migrainosus: Secondary | ICD-10-CM

## 2023-04-25 NOTE — Telephone Encounter (Signed)
LOV: 03/23/23 Last fill: 02/23/23, 30 tablets 1 refill

## 2023-07-06 ENCOUNTER — Ambulatory Visit (INDEPENDENT_AMBULATORY_CARE_PROVIDER_SITE_OTHER): Payer: 59 | Admitting: Family Medicine

## 2023-07-06 ENCOUNTER — Encounter: Payer: Self-pay | Admitting: Family Medicine

## 2023-07-06 VITALS — BP 124/84 | HR 64 | Temp 98.0°F | Ht 67.0 in | Wt 249.0 lb

## 2023-07-06 DIAGNOSIS — E039 Hypothyroidism, unspecified: Secondary | ICD-10-CM | POA: Diagnosis not present

## 2023-07-06 DIAGNOSIS — G43009 Migraine without aura, not intractable, without status migrainosus: Secondary | ICD-10-CM

## 2023-07-06 DIAGNOSIS — N529 Male erectile dysfunction, unspecified: Secondary | ICD-10-CM

## 2023-07-06 DIAGNOSIS — F419 Anxiety disorder, unspecified: Secondary | ICD-10-CM | POA: Diagnosis not present

## 2023-07-06 DIAGNOSIS — E669 Obesity, unspecified: Secondary | ICD-10-CM | POA: Diagnosis not present

## 2023-07-06 MED ORDER — SILDENAFIL CITRATE 50 MG PO TABS: 50.0000 mg | ORAL_TABLET | Freq: Every day | ORAL | 0 refills | Status: DC | PRN

## 2023-07-06 NOTE — Progress Notes (Signed)
Subjective:     Patient ID: Philip Stephenson, male    DOB: Nov 23, 1995, 27 y.o.   MRN: [id number]  Chief Complaint  Patient presents with   Headache    Migraines seemed to be getting worse, medication from last visit was doing well for a while but started coming back and getting more consistent in the last 3 weeks   Weight Loss    Would like to discuss weight loss options.  Gym 4-5 days a week since last May and dieting. Hasn't dropped at all, just stable.     HPI  Discussed the use of AI scribe software for clinical note transcription with the patient, who gave verbal consent to proceed.  History of Present Illness         Other providers:  Pulmonologist- [facility] for asthma.    Hypothyroidism, mild in the past. No medication in the past couple of years.   C/o a 3 wk hx of increased headaches. Some feel like his usual migraines and others feel different.  He had 2 headaches this week but he had 6 last week.   Reports increased work stress.   He was seeing a psychiatrist online in 2022 for anxiety. He was taking Paxil but ran out towards the end of 2023. He was taking 30 mg when he stopped it.   Taking Paxil 20 mg now and propranolol 20 mg.   Having issues with ED. Trouble getting and maintaining erection. Causing his anxiety to worsen.   Sex drive is present Spontaneous erections present     Health Maintenance Due  Topic Date Due   INFLUENZA VACCINE  05/26/2023   COVID-19 Vaccine (6 - 2023-24 season) 06/26/2023    Past Medical History:  Diagnosis Date   Allergy    Asthma     Past Surgical History:  Procedure Laterality Date   APPENDECTOMY     HERNIA REPAIR      Family History  Problem Relation Age of Onset   Basal cell carcinoma Mother    Depression Mother    Cancer Mother        basal cell carcinoma nose   Diabetes Father    Thyroid disease Father    Atrial fibrillation Father    Heart failure Father    Heart disease Father    Depression Sister     Depression Sister    Heart disease Paternal Grandfather    Prostate cancer Paternal Grandfather    Diabetes Paternal Grandfather     Social History   Socioeconomic History   Marital status: Single    Spouse name: Not on file   Number of children: Not on file   Years of education: Not on file   Highest education level: Bachelor's degree (e.g., BA, AB, BS)  Occupational History   Not on file  Tobacco Use   Smoking status: Never   Smokeless tobacco: Never  Vaping Use   Vaping status: Never Used  Substance and Sexual Activity   Alcohol use: Yes    Comment: rarely   Drug use: No   Sexual activity: Yes  Other Topics Concern   Not on file  Social History Narrative   Not on file   Social Determinants of Health   Financial Resource Strain: Low Risk  (02/23/2023)   Overall Financial Resource Strain (CARDIA)    Difficulty of Paying Living Expenses: Not hard at all  Food Insecurity: No Food Insecurity (02/23/2023)   Hunger Vital Sign    Worried About  Running Out of Food in the Last Year: Never true    Ran Out of Food in the Last Year: Never true  Transportation Needs: No Transportation Needs (02/23/2023)   PRAPARE - Administrator, Civil Service (Medical): No    Lack of Transportation (Non-Medical): No  Physical Activity: Insufficiently Active (02/23/2023)   Exercise Vital Sign    Days of Exercise per Week: 3 days    Minutes of Exercise per Session: 30 min  Stress: Stress Concern Present (02/23/2023)   [facility] Stephenson of [employer]ational [employer] - Occupational Stress Questionnaire    Feeling of Stress : Very much  Social Connections: Moderately Isolated (02/23/2023)   Social Connection and Isolation Panel [NHANES]    Frequency of Communication with Friends and Family: Twice a week    Frequency of Social Gatherings with Friends and Family: Once a week    Attends Religious Services: Never    Database administrator or Organizations: No    Attends Hospital doctor: Not on file    Marital Status: Living with partner  Intimate Partner Violence: Not on file    Outpatient Medications Prior to Visit  Medication Sig Dispense Refill   albuterol (VENTOLIN HFA) 108 (90 Base) MCG/ACT inhaler Inhale 2 puffs into the lungs every 6 (six) hours as needed for wheezing or shortness of breath. 18 g 0   cetirizine (ZYRTEC) 10 MG tablet Take 10 mg by mouth daily as needed for allergies.     EPINEPHrine 0.3 mg/0.3 mL IJ SOAJ injection auto-injector into outer thigh     PARoxetine (PAXIL) 20 MG tablet TAKE 1 TABLET BY MOUTH EVERY DAY 30 tablet 2   propranolol (INDERAL) 20 MG tablet TAKE 1 TABLET BY MOUTH EVERY DAY 90 tablet 1   No facility-administered medications prior to visit.    No Known Allergies  Review of Systems  Constitutional:  Positive for malaise/fatigue. Negative for chills, fever and weight loss.  Eyes:  Negative for blurred vision and double vision.  Respiratory:  Negative for shortness of breath.   Cardiovascular:  Negative for chest pain, palpitations and leg swelling.  Gastrointestinal:  Negative for abdominal pain, constipation, diarrhea, nausea and vomiting.  Genitourinary:  Negative for dysuria, frequency and urgency.  Neurological:  Positive for headaches. Negative for dizziness and focal weakness.  Psychiatric/Behavioral:  The patient is nervous/anxious.        Objective:    Physical Exam Constitutional:      General: He is not in acute distress.    Appearance: He is not ill-appearing.  HENT:     Mouth/Throat:     Mouth: Mucous membranes are moist.  Eyes:     Extraocular Movements: Extraocular movements intact.     Right eye: Normal extraocular motion.     Left eye: Normal extraocular motion.     Conjunctiva/sclera: Conjunctivae normal.     Pupils: Pupils are equal, round, and reactive to light.  Cardiovascular:     Rate and Rhythm: Normal rate and regular rhythm.  Pulmonary:     Effort: Pulmonary effort is normal.      Breath sounds: Normal breath sounds.  Musculoskeletal:        General: Normal range of motion.     Cervical back: Normal range of motion and neck supple.  Skin:    General: Skin is warm and dry.  Neurological:     General: No focal deficit present.     Mental Status: He is alert and oriented  to person, place, and time.     Cranial Nerves: No cranial nerve deficit or facial asymmetry.     Sensory: No sensory deficit.     Coordination: Coordination normal.  Psychiatric:        Mood and Affect: Mood normal. Mood is not anxious or depressed.        Speech: Speech normal.        Behavior: Behavior normal. Behavior is not agitated.        Thought Content: Thought content normal.      BP 124/84 (BP Location: Left Arm, Patient Position: Sitting, Cuff Size: Large)   Pulse 64   Temp 98 F (36.7 C) (Temporal)   Ht 5\' 7"  (1.702 m)   Wt 249 lb (112.9 kg)   SpO2 100%   BMI 39.00 kg/m  Wt Readings from Last 3 Encounters:  07/06/23 249 lb (112.9 kg)  02/23/23 246 lb (111.6 kg)  09/30/22 249 lb (112.9 kg)       Assessment & Plan:   Problem List Items Addressed This Visit       Cardiovascular and Mediastinum   Migraine without aura and without status migrainosus, not intractable    Last week headaches more frequent but fewer this week. Continue current medications. Discussed regular sleep, hydration, meals, and no fluctuation in caffeine. Follow up if not improving. Consider referral to neurologist.       Relevant Medications   sildenafil (VIAGRA) 50 MG tablet     Endocrine   Acquired hypothyroidism    Is not on medication. Check TSH, free T4 and follow up.       Relevant Orders   CBC with Differential/Platelet (Completed)   Comprehensive metabolic panel (Completed)   TSH (Completed)   T4, free (Completed)     Other   Anxiety - Primary    Not well controlled.  Continue Paxil. Recommend scheduling with a therapist.  Check labs.       Erectile dysfunction    Most  likely related to medications and anxiety. Check labs. He may try Viagra. Follow up pending results.       Relevant Medications   sildenafil (VIAGRA) 50 MG tablet   Other Relevant Orders   CBC with Differential/Platelet (Completed)   Comprehensive metabolic panel (Completed)   TSH (Completed)   T4, free (Completed)   Prolactin (Completed)   Testosterone (Completed)   Other Visit Diagnoses     Obesity (BMI 30-39.9)       Relevant Orders   CBC with Differential/Platelet (Completed)   Comprehensive metabolic panel (Completed)   TSH (Completed)   T4, free (Completed)       I am having Molli Hazard Morlock start on sildenafil. I am also having him maintain his albuterol, cetirizine, EPINEPHrine, propranolol, and PARoxetine.  Meds ordered this encounter  Medications   sildenafil (VIAGRA) 50 MG tablet    Sig: Take 1 tablet (50 mg total) by mouth daily as needed for erectile dysfunction.    Dispense:  10 tablet    Refill:  0    Order Specific Question:   Supervising Provider    Answer:   Hillard Danker A [[id number]]

## 2023-07-06 NOTE — Patient Instructions (Signed)
 [email or web address]   [email or web address]   [facility] Multiple locations [phone]     [facility] [address] [place], [place] [address]  Phone: [phone]   [facility]  [address], [place], [place] [address]  Phone: [phone]

## 2023-07-08 ENCOUNTER — Other Ambulatory Visit (INDEPENDENT_AMBULATORY_CARE_PROVIDER_SITE_OTHER): Payer: 59

## 2023-07-08 DIAGNOSIS — E039 Hypothyroidism, unspecified: Secondary | ICD-10-CM

## 2023-07-08 DIAGNOSIS — N529 Male erectile dysfunction, unspecified: Secondary | ICD-10-CM

## 2023-07-08 DIAGNOSIS — E669 Obesity, unspecified: Secondary | ICD-10-CM | POA: Diagnosis not present

## 2023-07-08 LAB — COMPREHENSIVE METABOLIC PANEL
ALT: 25 U/L (ref 0–53)
AST: 17 U/L (ref 0–37)
Albumin: 4.2 g/dL (ref 3.5–5.2)
Alkaline Phosphatase: 58 U/L (ref 39–117)
BUN: 13 mg/dL (ref 6–23)
CO2: 31 meq/L (ref 19–32)
Calcium: 9.3 mg/dL (ref 8.4–10.5)
Chloride: 101 meq/L (ref 96–112)
Creatinine, Ser: 0.92 mg/dL (ref 0.40–1.50)
GFR: 113.28 mL/min (ref >60.00)
Glucose, Bld: 109 mg/dL — ABNORMAL HIGH (ref 70–99)
Potassium: 3.8 meq/L (ref 3.5–5.1)
Sodium: 140 meq/L (ref 135–145)
Total Bilirubin: 0.6 mg/dL (ref 0.2–1.2)
Total Protein: 7.6 g/dL (ref 6.0–8.3)

## 2023-07-08 LAB — CBC WITH DIFFERENTIAL/PLATELET
Basophils Absolute: 0.1 10*3/uL (ref 0.0–0.1)
Basophils Relative: 0.7 % (ref 0.0–3.0)
Eosinophils Absolute: 0.3 10*3/uL (ref 0.0–0.7)
Eosinophils Relative: 3 % (ref 0.0–5.0)
HCT: 44 % (ref 39.0–52.0)
Hemoglobin: 14.6 g/dL (ref 13.0–17.0)
Lymphocytes Relative: 35.5 % (ref 12.0–46.0)
Lymphs Abs: 3.3 10*3/uL (ref 0.7–4.0)
MCHC: 33.1 g/dL (ref 30.0–36.0)
MCV: 93 fl (ref 78.0–100.0)
Monocytes Absolute: 0.6 10*3/uL (ref 0.1–1.0)
Monocytes Relative: 5.9 % (ref 3.0–12.0)
Neutro Abs: 5.2 10*3/uL (ref 1.4–7.7)
Neutrophils Relative %: 54.9 % (ref 43.0–77.0)
Platelets: 287 10*3/uL (ref 150.0–400.0)
RBC: 4.73 Mil/uL (ref 4.22–5.81)
RDW: 12.5 % (ref 11.5–15.5)
WBC: 9.4 10*3/uL (ref 4.0–10.5)

## 2023-07-08 LAB — TESTOSTERONE: Testosterone: 186.58 ng/dL — ABNORMAL LOW (ref 300.00–890.00)

## 2023-07-08 LAB — T4, FREE: Free T4: 1.16 ng/dL (ref 0.60–1.60)

## 2023-07-08 LAB — TSH: TSH: 4.03 u[IU]/mL (ref 0.35–5.50)

## 2023-07-09 LAB — PROLACTIN: Prolactin: 5.6 ng/mL (ref 2.0–18.0)

## 2023-07-11 DIAGNOSIS — N529 Male erectile dysfunction, unspecified: Secondary | ICD-10-CM | POA: Insufficient documentation

## 2023-07-11 NOTE — Assessment & Plan Note (Signed)
Is not on medication. Check TSH, free T4 and follow up.

## 2023-07-11 NOTE — Assessment & Plan Note (Signed)
Not well controlled.  Continue Paxil. Recommend scheduling with a therapist.  Check labs.

## 2023-07-11 NOTE — Assessment & Plan Note (Signed)
Most likely related to medications and anxiety. Check labs. He may try Viagra. Follow up pending results.

## 2023-07-11 NOTE — Assessment & Plan Note (Signed)
Last week headaches more frequent but fewer this week. Continue current medications. Discussed regular sleep, hydration, meals, and no fluctuation in caffeine. Follow up if not improving. Consider referral to neurologist.

## 2023-07-12 ENCOUNTER — Other Ambulatory Visit: Payer: Self-pay | Admitting: Family Medicine

## 2023-07-12 DIAGNOSIS — R7989 Other specified abnormal findings of blood chemistry: Secondary | ICD-10-CM

## 2023-07-12 NOTE — Progress Notes (Signed)
His testosterone level is low. It is recommended to get 2 testosterone levels. We need to have him come in between 8am-10am in 2 weeks to recheck. His labs are fine otherwise.

## 2023-07-14 ENCOUNTER — Telehealth: Payer: Self-pay | Admitting: Family Medicine

## 2023-07-14 NOTE — Telephone Encounter (Signed)
Patient returned Hannah's call about his lab results. He scheduled a lab appointment per PCP's request, but would like to speak with Dahlia Client to go over his results. Best callback is [phone].

## 2023-07-15 NOTE — Telephone Encounter (Signed)
Called pt back 9/19 please see lab encounter

## 2023-07-21 ENCOUNTER — Encounter: Payer: Self-pay | Admitting: Family Medicine

## 2023-07-21 NOTE — Telephone Encounter (Signed)
FYI.. have added to allergy list

## 2023-07-22 ENCOUNTER — Other Ambulatory Visit: Payer: 59

## 2023-07-26 LAB — TESTOSTERONE, FREE & TOTAL
Free Testosterone: 41.3 pg/mL (ref 35.0–155.0)
Testosterone, Total, LC-MS-MS: 168 ng/dL — ABNORMAL LOW (ref 250–1100)

## 2023-07-28 ENCOUNTER — Ambulatory Visit: Payer: 59

## 2023-07-28 ENCOUNTER — Ambulatory Visit: Payer: 59 | Admitting: Family Medicine

## 2023-07-28 ENCOUNTER — Encounter: Payer: Self-pay | Admitting: Family Medicine

## 2023-07-28 VITALS — BP 120/84 | HR 75 | Temp 97.8°F | Ht 67.0 in | Wt 243.0 lb

## 2023-07-28 DIAGNOSIS — J4530 Mild persistent asthma, uncomplicated: Secondary | ICD-10-CM | POA: Diagnosis not present

## 2023-07-28 DIAGNOSIS — E039 Hypothyroidism, unspecified: Secondary | ICD-10-CM

## 2023-07-28 DIAGNOSIS — R071 Chest pain on breathing: Secondary | ICD-10-CM

## 2023-07-28 DIAGNOSIS — R0609 Other forms of dyspnea: Secondary | ICD-10-CM

## 2023-07-28 LAB — TROPONIN I (HIGH SENSITIVITY): High Sens Troponin I: 3 ng/L (ref 2–17)

## 2023-07-28 LAB — D-DIMER, QUANTITATIVE: D-Dimer, Quant: <0.19 ug{FEU}/mL (ref <0.50)

## 2023-07-28 MED ORDER — EPINEPHRINE 0.3 MG/0.3ML IJ SOAJ: INTRAMUSCULAR | 2 refills | Status: AC

## 2023-07-28 NOTE — Progress Notes (Signed)
His morning time testosterone was low. He will need another morning time (before 9 am) testosterone level along with FSH and LH. Please schedule an ov for this so we can follow up on chest pain and discuss this further.

## 2023-07-28 NOTE — Progress Notes (Signed)
Subjective:     Patient ID: Philip Stephenson, male    DOB: 1996-02-05, 27 y.o.   MRN: [id number]  Chief Complaint  Patient presents with   Chest Pain    Heavy in the center and tightness, pain radiating to side and downwards when breaths. Goes away but flares back up, ever since allergic reaction about 2 weeks ago    HPI  Discussed the use of AI scribe software for clinical note transcription with the patient, who gave verbal consent to proceed.  History of Present Illness          C/o a 2 wk hx of intermittent mid sternal chest pain and shortness of breath. Symptoms began after taking Viagra.   Denies hx of blood clot. No recent travel.     There are no preventive care reminders to display for this patient.  Past Medical History:  Diagnosis Date   Allergy    Asthma     Past Surgical History:  Procedure Laterality Date   APPENDECTOMY     HERNIA REPAIR      Family History  Problem Relation Age of Onset   Basal cell carcinoma Mother    Depression Mother    Cancer Mother        basal cell carcinoma nose   Diabetes Father    Thyroid disease Father    Atrial fibrillation Father    Heart failure Father    Heart disease Father    Depression Sister    Depression Sister    Heart disease Paternal Grandfather    Prostate cancer Paternal Grandfather    Diabetes Paternal Grandfather     Social History   Socioeconomic History   Marital status: Single    Spouse name: Not on file   Number of children: Not on file   Years of education: Not on file   Highest education level: Bachelor's degree (e.g., BA, AB, BS)  Occupational History   Not on file  Tobacco Use   Smoking status: Never   Smokeless tobacco: Never  Vaping Use   Vaping status: Never Used  Substance and Sexual Activity   Alcohol use: Yes    Comment: rarely   Drug use: No   Sexual activity: Yes  Other Topics Concern   Not on file  Social History Narrative   Not on file   Social Determinants  of Health   Financial Resource Strain: Low Risk  (02/23/2023)   Overall Financial Resource Strain (CARDIA)    Difficulty of Paying Living Expenses: Not hard at all  Food Insecurity: No Food Insecurity (02/23/2023)   Hunger Vital Sign    Worried About Running Out of Food in the Last Year: Never true    Ran Out of Food in the Last Year: Never true  Transportation Needs: No Transportation Needs (02/23/2023)   PRAPARE - Administrator, Civil Service (Medical): No    Lack of Transportation (Non-Medical): No  Physical Activity: Insufficiently Active (02/23/2023)   Exercise Vital Sign    Days of Exercise per Week: 3 days    Minutes of Exercise per Session: 30 min  Stress: Stress Concern Present (02/23/2023)   [employer] of Occupational Health - Occupational Stress Questionnaire    Feeling of Stress : Very much  Social Connections: Moderately Isolated (02/23/2023)   Social Connection and Isolation Panel [NHANES]    Frequency of Communication with Friends and Family: Twice a week    Frequency of Social Gatherings with Friends and  Family: Once a week    Attends Religious Services: Never    Database administrator or Organizations: No    Attends Engineer, structural: Not on file    Marital Status: Living with partner  Intimate Partner Violence: Not on file    Outpatient Medications Prior to Visit  Medication Sig Dispense Refill   albuterol (VENTOLIN HFA) 108 (90 Base) MCG/ACT inhaler Inhale 2 puffs into the lungs every 6 (six) hours as needed for wheezing or shortness of breath. 18 g 0   cetirizine (ZYRTEC) 10 MG tablet Take 10 mg by mouth daily as needed for allergies.     PARoxetine (PAXIL) 20 MG tablet TAKE 1 TABLET BY MOUTH EVERY DAY 30 tablet 2   propranolol (INDERAL) 20 MG tablet TAKE 1 TABLET BY MOUTH EVERY DAY 90 tablet 1   EPINEPHrine 0.3 mg/0.3 mL IJ SOAJ injection auto-injector into outer thigh     sildenafil (VIAGRA) 50 MG tablet Take 1 tablet (50 mg total) by  mouth daily as needed for erectile dysfunction. 10 tablet 0   No facility-administered medications prior to visit.    Allergies  Allergen Reactions   Sildenafil Shortness Of Breath, Swelling and Other (See Comments)    Drowsiness and Chest Pain    Review of Systems  Constitutional:  Negative for chills and fever.  Respiratory:  Positive for shortness of breath. Negative for cough.   Cardiovascular:  Positive for chest pain. Negative for palpitations and leg swelling.  Gastrointestinal:  Negative for abdominal pain, constipation, diarrhea, nausea and vomiting.  Genitourinary:  Negative for dysuria, frequency and urgency.  Neurological:  Negative for dizziness, focal weakness and headaches.       Objective:    Physical Exam Constitutional:      General: He is not in acute distress.    Appearance: He is not ill-appearing.  Eyes:     Extraocular Movements: Extraocular movements intact.     Conjunctiva/sclera: Conjunctivae normal.     Pupils: Pupils are equal, round, and reactive to light.  Neck:     Thyroid: No thyromegaly.     Vascular: No JVD.  Cardiovascular:     Rate and Rhythm: Normal rate and regular rhythm.  Pulmonary:     Effort: Pulmonary effort is normal.     Breath sounds: Normal breath sounds.  Musculoskeletal:     Cervical back: No tenderness.     Right lower leg: No edema.     Left lower leg: No edema.  Lymphadenopathy:     Cervical: No cervical adenopathy.  Skin:    General: Skin is warm and dry.  Neurological:     General: No focal deficit present.     Mental Status: He is alert and oriented to person, place, and time.     Cranial Nerves: No cranial nerve deficit.     Motor: No weakness.     Coordination: Coordination normal.     Gait: Gait normal.  Psychiatric:        Mood and Affect: Mood normal.        Behavior: Behavior normal.        Thought Content: Thought content normal.      BP 120/84 (BP Location: Left Arm, Patient Position: Sitting,  Cuff Size: Large)   Pulse 75   Temp 97.8 F (36.6 C) (Temporal)   Ht 5\' 7"  (1.702 m)   Wt 243 lb (110.2 kg)   SpO2 98%   BMI 38.06 kg/m  Wt Readings  from Last 3 Encounters:  07/28/23 243 lb (110.2 kg)  07/06/23 249 lb (112.9 kg)  02/23/23 246 lb (111.6 kg)       Assessment & Plan:   Problem List Items Addressed This Visit       Respiratory   Mild persistent asthma without complication     Endocrine   Acquired hypothyroidism   Other Visit Diagnoses     DOE (dyspnea on exertion)    -  Primary   Relevant Orders   Troponin I (High Sensitivity) (Completed)   D-dimer, quantitative (Completed)   Brain natriuretic peptide   CBC with Differential/Platelet   Comprehensive metabolic panel   DG Chest 2 View (Completed)   Chest pain on breathing       Relevant Orders   Troponin I (High Sensitivity) (Completed)   D-dimer, quantitative (Completed)   Brain natriuretic peptide   CBC with Differential/Platelet   Comprehensive metabolic panel   DG Chest 2 View (Completed)      EKG shows sinus bradycardia, rate 55, arrhythmia, unchanged from previous  Reassuring  STAT Troponin, BNP, D-dimer along with CBC, CMP ordered.  STAT chest X ray ordered.  He will not take Viagra or medications in this class again.  Reviewed cardiology notes, labs and echocardiogram from 2023. Follow up pending labs. He may follow up with cardiologist if not improving.   I have discontinued Areg Skeens's sildenafil. I have also changed his EPINEPHrine. Additionally, I am having him maintain his albuterol, cetirizine, propranolol, and PARoxetine.  Meds ordered this encounter  Medications   EPINEPHrine 0.3 mg/0.3 mL IJ SOAJ injection    Sig: Inject into outer thigh    Dispense:  1 each    Refill:  2

## 2023-07-29 ENCOUNTER — Other Ambulatory Visit: Payer: Self-pay | Admitting: Family Medicine

## 2023-07-29 ENCOUNTER — Encounter: Payer: Self-pay | Admitting: Family Medicine

## 2023-07-29 DIAGNOSIS — F419 Anxiety disorder, unspecified: Secondary | ICD-10-CM

## 2023-07-29 DIAGNOSIS — G43009 Migraine without aura, not intractable, without status migrainosus: Secondary | ICD-10-CM

## 2023-07-29 LAB — COMPREHENSIVE METABOLIC PANEL
ALT: 29 U/L (ref 0–53)
AST: 21 U/L (ref 0–37)
Albumin: 4.4 g/dL (ref 3.5–5.2)
Alkaline Phosphatase: 63 U/L (ref 39–117)
BUN: 12 mg/dL (ref 6–23)
CO2: 28 meq/L (ref 19–32)
Calcium: 9.4 mg/dL (ref 8.4–10.5)
Chloride: 102 meq/L (ref 96–112)
Creatinine, Ser: 0.98 mg/dL (ref 0.40–1.50)
GFR: 104.97 mL/min (ref >60.00)
Glucose, Bld: 93 mg/dL (ref 70–99)
Potassium: 4.4 meq/L (ref 3.5–5.1)
Sodium: 141 meq/L (ref 135–145)
Total Bilirubin: 1.2 mg/dL (ref 0.2–1.2)
Total Protein: 7.7 g/dL (ref 6.0–8.3)

## 2023-07-29 LAB — CBC WITH DIFFERENTIAL/PLATELET
Basophils Absolute: 0.1 10*3/uL (ref 0.0–0.1)
Basophils Relative: 0.8 % (ref 0.0–3.0)
Eosinophils Absolute: 0.2 10*3/uL (ref 0.0–0.7)
Eosinophils Relative: 2.9 % (ref 0.0–5.0)
HCT: 44.7 % (ref 39.0–52.0)
Hemoglobin: 14.8 g/dL (ref 13.0–17.0)
Lymphocytes Relative: 22.4 % (ref 12.0–46.0)
Lymphs Abs: 1.9 10*3/uL (ref 0.7–4.0)
MCHC: 33.1 g/dL (ref 30.0–36.0)
MCV: 92.3 fL (ref 78.0–100.0)
Monocytes Absolute: 0.7 10*3/uL (ref 0.1–1.0)
Monocytes Relative: 8 % (ref 3.0–12.0)
Neutro Abs: 5.5 10*3/uL (ref 1.4–7.7)
Neutrophils Relative %: 65.9 % (ref 43.0–77.0)
Platelets: 282 10*3/uL (ref 150.0–400.0)
RBC: 4.84 Mil/uL (ref 4.22–5.81)
RDW: 12.4 % (ref 11.5–15.5)
WBC: 8.3 10*3/uL (ref 4.0–10.5)

## 2023-07-29 LAB — BRAIN NATRIURETIC PEPTIDE: Pro B Natriuretic peptide (BNP): 11 pg/mL (ref 0.0–100.0)

## 2023-07-29 NOTE — Addendum Note (Signed)
Addended by: Marinus Maw on: 07/29/2023 02:07 PM   Modules accepted: Orders

## 2023-08-09 ENCOUNTER — Ambulatory Visit (INDEPENDENT_AMBULATORY_CARE_PROVIDER_SITE_OTHER): Payer: 59 | Admitting: Family Medicine

## 2023-08-09 VITALS — BP 122/74 | HR 79 | Temp 97.8°F | Ht 67.0 in | Wt 245.0 lb

## 2023-08-09 DIAGNOSIS — Z833 Family history of diabetes mellitus: Secondary | ICD-10-CM | POA: Diagnosis not present

## 2023-08-09 DIAGNOSIS — E039 Hypothyroidism, unspecified: Secondary | ICD-10-CM

## 2023-08-09 DIAGNOSIS — N529 Male erectile dysfunction, unspecified: Secondary | ICD-10-CM

## 2023-08-09 DIAGNOSIS — E669 Obesity, unspecified: Secondary | ICD-10-CM | POA: Diagnosis not present

## 2023-08-09 DIAGNOSIS — R7989 Other specified abnormal findings of blood chemistry: Secondary | ICD-10-CM | POA: Diagnosis not present

## 2023-08-09 DIAGNOSIS — J4530 Mild persistent asthma, uncomplicated: Secondary | ICD-10-CM | POA: Diagnosis not present

## 2023-08-09 DIAGNOSIS — Z23 Encounter for immunization: Secondary | ICD-10-CM

## 2023-08-09 LAB — LIPID PANEL
Cholesterol: 165 mg/dL (ref 0–200)
HDL: 44 mg/dL (ref >39.00)
LDL Cholesterol: 69 mg/dL (ref 0–99)
NonHDL: 120.91
Total CHOL/HDL Ratio: 4
Triglycerides: 260 mg/dL — ABNORMAL HIGH (ref 0.0–149.0)
VLDL: 52 mg/dL — ABNORMAL HIGH (ref 0.0–40.0)

## 2023-08-09 LAB — T4, FREE: Free T4: 0.89 ng/dL (ref 0.60–1.60)

## 2023-08-09 LAB — TSH: TSH: 3.98 u[IU]/mL (ref 0.35–5.50)

## 2023-08-09 LAB — HEMOGLOBIN A1C: Hgb A1c MFr Bld: 5.4 % (ref 4.6–6.5)

## 2023-08-09 NOTE — Patient Instructions (Addendum)
Please go downstairs for labs.   Schedule a follow up with your cardiologist. Let me know if you need a referral.   Work on eating a diet low in sugar, carbohydrates and processed foods.   Try to increase your physical activity and aim for 150 minutes each week.   Ask your spouse/significant other to tell you if you snore or stop breathing. This could be a sign of sleep apnea and will require testing.   We will be in touch with your results.

## 2023-08-09 NOTE — Assessment & Plan Note (Signed)
Is not on medication. Check TSH, free T4 and follow up.

## 2023-08-09 NOTE — Assessment & Plan Note (Addendum)
Recheck testosterone. Add FSH, LH. Discussed weight loss can help improve testosterone. Refer to urologist or endocrinologist if testosterone is still low.

## 2023-08-09 NOTE — Progress Notes (Signed)
Subjective:     Patient ID: Philip Stephenson, male    DOB: 1996/04/04, 27 y.o.   MRN: [id number]  Chief Complaint  Patient presents with   Follow-up    States chest pain is not as bad or constant, so better but still has random times of the chest pain/tightness    HPI   History of Present Illness         Here to recheck low testosterone and to follow up on check pain and shortness of breath.   States he stopped propranolol 1-2 weeks ago. Pulse is no longer in the 40s. Chest pain intermittent and improving.  He is not using albuterol. Asthma does not feel bothersome.  He has a cardiologist but has not followed up as of yet.   He was taking it for anxiety and migraine prevention.   Having issues with ED but this has also improved.   Father passed away at age 59 from MI.  DM, stents at age 14.   Mood is good.   Hx of hypothyroidism and was on medication at one point.   There are no preventive care reminders to display for this patient.  Past Medical History:  Diagnosis Date   Allergy    Asthma     Past Surgical History:  Procedure Laterality Date   APPENDECTOMY     HERNIA REPAIR      Family History  Problem Relation Age of Onset   Basal cell carcinoma Mother    Depression Mother    Cancer Mother        basal cell carcinoma nose   Diabetes Father    Thyroid disease Father    Atrial fibrillation Father    Heart failure Father    Heart disease Father    Depression Sister    Depression Sister    Heart disease Paternal Grandfather    Prostate cancer Paternal Grandfather    Diabetes Paternal Grandfather     Social History   Socioeconomic History   Marital status: Single    Spouse name: Not on file   Number of children: Not on file   Years of education: Not on file   Highest education level: Bachelor's degree (e.g., BA, AB, BS)  Occupational History   Not on file  Tobacco Use   Smoking status: Never   Smokeless tobacco: Never  Vaping Use    Vaping status: Never Used  Substance and Sexual Activity   Alcohol use: Yes    Comment: rarely   Drug use: No   Sexual activity: Yes  Other Topics Concern   Not on file  Social History Narrative   Not on file   Social Determinants of Health   Financial Resource Strain: Low Risk  (08/09/2023)   Overall Financial Resource Strain (CARDIA)    Difficulty of Paying Living Expenses: Not hard at all  Food Insecurity: No Food Insecurity (08/09/2023)   Hunger Vital Sign    Worried About Running Out of Food in the Last Year: Never true    Ran Out of Food in the Last Year: Never true  Transportation Needs: No Transportation Needs (08/09/2023)   PRAPARE - Administrator, Civil Service (Medical): No    Lack of Transportation (Non-Medical): No  Physical Activity: Insufficiently Active (08/09/2023)   Exercise Vital Sign    Days of Exercise per Week: 3 days    Minutes of Exercise per Session: 40 min  Stress: Stress Concern Present (08/09/2023)   [place]  [employer]ational [employer] - Occupational Stress Questionnaire    Feeling of Stress : To some extent  Social Connections: Moderately Isolated (08/09/2023)   Social Connection and Isolation Panel [NHANES]    Frequency of Communication with Friends and Family: Three times a week    Frequency of Social Gatherings with Friends and Family: Three times a week    Attends Religious Services: Never    Active Member of Clubs or Organizations: No    Attends Engineer, structural: Not on file    Marital Status: Living with partner  Intimate Partner Violence: Not on file    Outpatient Medications Prior to Visit  Medication Sig Dispense Refill   albuterol (VENTOLIN HFA) 108 (90 Base) MCG/ACT inhaler Inhale 2 puffs into the lungs every 6 (six) hours as needed for wheezing or shortness of breath. 18 g 0   cetirizine (ZYRTEC) 10 MG tablet Take 10 mg by mouth daily as needed for allergies.     EPINEPHrine 0.3 mg/0.3 mL IJ SOAJ  injection Inject into outer thigh 1 each 2   PARoxetine (PAXIL) 20 MG tablet TAKE 1 TABLET BY MOUTH EVERY DAY 90 tablet 1   propranolol (INDERAL) 20 MG tablet TAKE 1 TABLET BY MOUTH EVERY DAY 90 tablet 1   No facility-administered medications prior to visit.    Allergies  Allergen Reactions   Sildenafil Shortness Of Breath, Swelling and Other (See Comments)    Drowsiness and Chest Pain    Review of Systems  Constitutional:  Negative for chills and fever.  Respiratory:  Negative for shortness of breath.   Cardiovascular:  Positive for chest pain. Negative for palpitations and leg swelling.  Gastrointestinal:  Negative for abdominal pain, constipation, diarrhea, nausea and vomiting.  Genitourinary:  Negative for dysuria, frequency and urgency.  Neurological:  Negative for dizziness and focal weakness.  Psychiatric/Behavioral:  Negative for depression and suicidal ideas. The patient is not nervous/anxious.        Objective:    Physical Exam Constitutional:      General: He is not in acute distress.    Appearance: He is not ill-appearing.  HENT:     Mouth/Throat:     Mouth: Mucous membranes are moist.  Eyes:     Extraocular Movements: Extraocular movements intact.     Conjunctiva/sclera: Conjunctivae normal.  Cardiovascular:     Rate and Rhythm: Normal rate and regular rhythm.  Pulmonary:     Effort: Pulmonary effort is normal.     Breath sounds: Normal breath sounds.  Musculoskeletal:     Cervical back: Normal range of motion and neck supple.     Right lower leg: No edema.     Left lower leg: No edema.  Skin:    General: Skin is warm and dry.  Neurological:     General: No focal deficit present.     Mental Status: He is alert and oriented to person, place, and time.     Motor: No weakness.     Coordination: Coordination normal.     Gait: Gait normal.  Psychiatric:        Mood and Affect: Mood normal.        Behavior: Behavior normal.        Thought Content:  Thought content normal.      BP 122/74 (BP Location: Left Arm, Patient Position: Sitting, Cuff Size: Large)   Pulse 79   Temp 97.8 F (36.6 C) (Temporal)   Ht 5\' 7"  (1.702 m)  Wt 245 lb (111.1 kg)   SpO2 99%   BMI 38.37 kg/m  Wt Readings from Last 3 Encounters:  08/09/23 245 lb (111.1 kg)  07/28/23 243 lb (110.2 kg)  07/06/23 249 lb (112.9 kg)       Assessment & Plan:   Problem List Items Addressed This Visit       Respiratory   Mild persistent asthma without complication    Controlled.         Endocrine   Acquired hypothyroidism    Is not on medication. Check TSH, free T4 and follow up.       Relevant Orders   TSH (Completed)   T4, free (Completed)     Other   Erectile dysfunction - Primary    Improving. Low testosterone and will recheck this morning. Adverse reactions to sildenafil. Will refer to urology if needed.       Relevant Orders   Testosterone , Free and Total   FSH/LH   Family history of diabetes mellitus (DM)    Check A1c. Counseling on healthy diet and exercise.       Relevant Orders   Hemoglobin A1c (Completed)   Low testosterone    Recheck testosterone. Add FSH, LH. Discussed weight loss can help improve testosterone. Refer to urologist or endocrinologist if testosterone is still low.       Relevant Orders   Testosterone , Free and Total   FSH/LH   Obesity (BMI 30-39.9)    Aware of potential complications related to obesity. Work on diet and exercise.       Relevant Orders   Hemoglobin A1c (Completed)   Lipid panel (Completed)   Other Visit Diagnoses     Need for influenza vaccination       Relevant Orders   Flu vaccine trivalent PF, 6mos and older(Flulaval,Afluria,Fluarix,Fluzone) (Completed)      He will follow up with cardiologist as discussed.   I have discontinued Philip Stephenson's propranolol. I am also having him maintain his albuterol, cetirizine, EPINEPHrine, and PARoxetine.  No orders of the defined types  were placed in this encounter.

## 2023-08-09 NOTE — Assessment & Plan Note (Signed)
Controlled.  

## 2023-08-09 NOTE — Assessment & Plan Note (Signed)
Check A1c. Counseling on healthy diet and exercise.

## 2023-08-09 NOTE — Assessment & Plan Note (Signed)
Improving. Low testosterone and will recheck this morning. Adverse reactions to sildenafil. Will refer to urology if needed.

## 2023-08-09 NOTE — Assessment & Plan Note (Signed)
Aware of potential complications related to obesity. Work on diet and exercise.

## 2023-08-13 LAB — FSH/LH
FSH: 2.9 m[IU]/mL (ref 1.4–12.8)
LH: 3 m[IU]/mL (ref 1.5–9.3)

## 2023-08-13 LAB — TESTOSTERONE, FREE & TOTAL
Free Testosterone: 53 pg/mL (ref 35.0–155.0)
Testosterone, Total, LC-MS-MS: 187 ng/dL — ABNORMAL LOW (ref 250–1100)

## 2023-08-15 NOTE — Progress Notes (Signed)
His testosterone is low again. I recommend a referral to endocrinology or urology for further evaluation and treatment if he is ok with this. Does he have a preference in regards to the specialist?

## 2023-08-16 ENCOUNTER — Other Ambulatory Visit: Payer: Self-pay | Admitting: Family Medicine

## 2023-08-16 DIAGNOSIS — N529 Male erectile dysfunction, unspecified: Secondary | ICD-10-CM

## 2023-08-16 DIAGNOSIS — E291 Testicular hypofunction: Secondary | ICD-10-CM

## 2023-09-09 ENCOUNTER — Ambulatory Visit (INDEPENDENT_AMBULATORY_CARE_PROVIDER_SITE_OTHER): Payer: 59

## 2023-09-09 ENCOUNTER — Ambulatory Visit (INDEPENDENT_AMBULATORY_CARE_PROVIDER_SITE_OTHER): Payer: 59 | Admitting: Family Medicine

## 2023-09-09 ENCOUNTER — Encounter: Payer: Self-pay | Admitting: Family Medicine

## 2023-09-09 VITALS — BP 128/78 | HR 93 | Temp 98.3°F | Ht 67.0 in | Wt 244.4 lb

## 2023-09-09 DIAGNOSIS — R042 Hemoptysis: Secondary | ICD-10-CM

## 2023-09-09 DIAGNOSIS — R509 Fever, unspecified: Secondary | ICD-10-CM

## 2023-09-09 DIAGNOSIS — R058 Other specified cough: Secondary | ICD-10-CM | POA: Diagnosis not present

## 2023-09-09 DIAGNOSIS — Z20828 Contact with and (suspected) exposure to other viral communicable diseases: Secondary | ICD-10-CM

## 2023-09-09 DIAGNOSIS — J4530 Mild persistent asthma, uncomplicated: Secondary | ICD-10-CM

## 2023-09-09 DIAGNOSIS — R062 Wheezing: Secondary | ICD-10-CM

## 2023-09-09 DIAGNOSIS — E291 Testicular hypofunction: Secondary | ICD-10-CM

## 2023-09-09 LAB — CBC WITH DIFFERENTIAL/PLATELET
Basophils Absolute: 0.1 10*3/uL (ref 0.0–0.1)
Basophils Relative: 0.8 % (ref 0.0–3.0)
Eosinophils Absolute: 0.6 10*3/uL (ref 0.0–0.7)
Eosinophils Relative: 4.8 % (ref 0.0–5.0)
HCT: 42.6 % (ref 39.0–52.0)
Hemoglobin: 13.9 g/dL (ref 13.0–17.0)
Lymphocytes Relative: 23 % (ref 12.0–46.0)
Lymphs Abs: 2.7 10*3/uL (ref 0.7–4.0)
MCHC: 32.6 g/dL (ref 30.0–36.0)
MCV: 93.2 fL (ref 78.0–100.0)
Monocytes Absolute: 1.1 10*3/uL — ABNORMAL HIGH (ref 0.1–1.0)
Monocytes Relative: 9 % (ref 3.0–12.0)
Neutro Abs: 7.3 10*3/uL (ref 1.4–7.7)
Neutrophils Relative %: 62.4 % (ref 43.0–77.0)
Platelets: 366 10*3/uL (ref 150.0–400.0)
RBC: 4.57 Mil/uL (ref 4.22–5.81)
RDW: 12.8 % (ref 11.5–15.5)
WBC: 11.8 10*3/uL — ABNORMAL HIGH (ref 4.0–10.5)

## 2023-09-09 MED ORDER — AMOXICILLIN-POT CLAVULANATE 875-125 MG PO TABS
1.0000 | ORAL_TABLET | Freq: Two times a day (BID) | ORAL | 0 refills | Status: DC
Start: 2023-09-09 — End: 2024-05-07

## 2023-09-09 MED ORDER — ALBUTEROL SULFATE HFA 108 (90 BASE) MCG/ACT IN AERS
2.0000 | INHALATION_SPRAY | Freq: Four times a day (QID) | RESPIRATORY_TRACT | 0 refills | Status: DC | PRN
Start: 2023-09-09 — End: 2023-10-06

## 2023-09-09 MED ORDER — PREDNISONE 20 MG PO TABS
40.0000 mg | ORAL_TABLET | Freq: Every day | ORAL | 0 refills | Status: DC
Start: 2023-09-09 — End: 2024-05-07

## 2023-09-09 NOTE — Patient Instructions (Addendum)
Please go downstairs for labs and a chest X ray.   Start oral steroids and antibiotic as prescribed with food and plenty of water.   Continue treating your symptoms with over the counter medications.   Follow up if you are getting worse or if you are not improving in the next 3-4 days.   Please schedule your urology appointment with [facility] Address [address] [place], [place], [place] [address]  Phone Number [phone]  Follow up with your cardiologist as recommended.

## 2023-09-09 NOTE — Progress Notes (Signed)
Subjective:  Philip Stephenson is a 27 y.o. male who presents for a 1 wk hx of fever, chills, body aches, cough with yellow to brown to blood clots. Ear pain and ST.   Denies dizziness, chest pain, palpitations, abdominal pain, V/D.   Exposure to flu at work last week.   Taking Tylenol, ibuprofen and Mucinex DM.  Ran out of albuterol.   ROS as in subjective.   Objective: Vitals:   09/09/23 1025  BP: 128/78  Pulse: 93  Temp: 98.3 F (36.8 C)  SpO2: 96%    General appearance: Alert, WD/WN, no distress, mildly ill appearing                             Skin: warm, no rash                           Head: + maxillary sinus tenderness                            Eyes: conjunctiva normal, corneas clear, PERRLA                            Ears: pearly TMs, external ear canals normal                          Nose: septum midline, turbinates swollen, with erythema and clear discharge             Mouth/throat: MMM, tongue normal, mild-mod pharyngeal erythema                           Neck: supple, no adenopathy, no thyromegaly, nontender                          Heart: RRR                         Lungs: + exp LLL wheezes,  scattered rhonchi      Assessment: Fever and chills - Plan: CBC with Differential/Platelet, DG Chest 2 View  Productive cough - Plan: CBC with Differential/Platelet, DG Chest 2 View, predniSONE (DELTASONE) 20 MG tablet, amoxicillin-clavulanate (AUGMENTIN) 875-125 MG tablet  Wheezing - Plan: predniSONE (DELTASONE) 20 MG tablet  Mild persistent asthma without complication - Plan: albuterol (VENTOLIN HFA) 108 (90 Base) MCG/ACT inhaler  Cough with hemoptysis - Plan: CBC with Differential/Platelet, DG Chest 2 View, amoxicillin-clavulanate (AUGMENTIN) 875-125 MG tablet  Exposure to the flu  Hypogonadism in male   Plan: Covid test negative  Flu test negative  STAT chest XR, CBC for hemoptysis, dark sputum.  Oral steroids and antibiotic Albuterol refilled.  Follow  up if not improving in 3-4 day or if worsening.

## 2023-10-06 ENCOUNTER — Other Ambulatory Visit: Payer: Self-pay | Admitting: Family Medicine

## 2023-10-06 DIAGNOSIS — J4530 Mild persistent asthma, uncomplicated: Secondary | ICD-10-CM

## 2024-05-02 ENCOUNTER — Other Ambulatory Visit: Payer: Self-pay

## 2024-05-02 ENCOUNTER — Emergency Department (HOSPITAL_COMMUNITY): Payer: Self-pay

## 2024-05-02 ENCOUNTER — Encounter (HOSPITAL_COMMUNITY): Payer: Self-pay | Admitting: Emergency Medicine

## 2024-05-02 ENCOUNTER — Emergency Department (HOSPITAL_COMMUNITY)
Admission: EM | Admit: 2024-05-02 | Discharge: 2024-05-02 | Disposition: A | Discharge status: Home / Self Care | Payer: Self-pay | Attending: Emergency Medicine | Admitting: Emergency Medicine

## 2024-05-02 DIAGNOSIS — M549 Dorsalgia, unspecified: Secondary | ICD-10-CM | POA: Insufficient documentation

## 2024-05-02 DIAGNOSIS — E039 Hypothyroidism, unspecified: Secondary | ICD-10-CM | POA: Diagnosis not present

## 2024-05-02 DIAGNOSIS — R42 Dizziness and giddiness: Secondary | ICD-10-CM | POA: Insufficient documentation

## 2024-05-02 DIAGNOSIS — R202 Paresthesia of skin: Secondary | ICD-10-CM | POA: Insufficient documentation

## 2024-05-02 DIAGNOSIS — R079 Chest pain, unspecified: Secondary | ICD-10-CM

## 2024-05-02 DIAGNOSIS — R0789 Other chest pain: Secondary | ICD-10-CM | POA: Diagnosis present

## 2024-05-02 LAB — BASIC METABOLIC PANEL WITH GFR
Anion gap: 10 (ref 5–15)
BUN: 8 mg/dL (ref 6–20)
CO2: 26 mmol/L (ref 22–32)
Calcium: 9.1 mg/dL (ref 8.9–10.3)
Chloride: 104 mmol/L (ref 98–111)
Creatinine, Ser: 1.01 mg/dL (ref 0.61–1.24)
GFR, Estimated: >60 mL/min (ref >60)
Glucose, Bld: 107 mg/dL — ABNORMAL HIGH (ref 70–99)
Potassium: 3.9 mmol/L (ref 3.5–5.1)
Sodium: 140 mmol/L (ref 135–145)

## 2024-05-02 LAB — CBC
HCT: 47.4 % (ref 39.0–52.0)
Hemoglobin: 15.6 g/dL (ref 13.0–17.0)
MCH: 30.8 pg (ref 26.0–34.0)
MCHC: 32.9 g/dL (ref 30.0–36.0)
MCV: 93.7 fL (ref 80.0–100.0)
Platelets: 298 K/uL (ref 150–400)
RBC: 5.06 MIL/uL (ref 4.22–5.81)
RDW: 12.9 % (ref 11.5–15.5)
WBC: 9.5 K/uL (ref 4.0–10.5)
nRBC: 0 % (ref 0.0–0.2)

## 2024-05-02 LAB — D-DIMER, QUANTITATIVE: D-Dimer, Quant: <0.27 ug{FEU}/mL (ref 0.00–0.50)

## 2024-05-02 LAB — TROPONIN I (HIGH SENSITIVITY)
Troponin I (High Sensitivity): 3 ng/L (ref <18)
Troponin I (High Sensitivity): 3 ng/L (ref <18)

## 2024-05-02 LAB — TSH: TSH: 3.334 u[IU]/mL (ref 0.350–4.500)

## 2024-05-02 NOTE — ED Triage Notes (Addendum)
 Patient arrives POV with persistent centrally located chest pain for 8 hours while at work. He states he became diaphoretic, then his watch alerted him to a high HR of 138. Endorses shortness of breath. Describes it as a dull pain. Reports dizziness especially with positional changes.

## 2024-05-02 NOTE — Discharge Instructions (Signed)
 Our cardiology office will reach out to you for an outpatient follow-up for further assessment of your chest discomfort and fast heart rate.

## 2024-05-02 NOTE — ED Provider Notes (Signed)
 [place] EMERGENCY DEPARTMENT AT [facility] Provider Note   CSN: [id number] Arrival date & time: 05/02/24  9541     Patient presents with: Chest Pain   Philip Stephenson is a 28 y.o. male.   The history is provided by the patient, the spouse and medical records. No language interpreter was used.  Chest Pain    28 year old male history of hypothyroidism, anxiety, asthma, presenting with complaints of chest pain.  Last night while at work he noticed pain in his chest.  He described pain as a pressure sensation across his chest, felt a bit lightheadedness, some pain in his back, and tingling sensation to his hands.  His wife indicates that his heart rate was in the 130s for approximately 4 hours despite not exerting himself.  He checked his blood pressure and it was in the 130 systolic.  He went to sleep and woke up this morning with the same symptoms prompting this ER visit.  He does report family history of cardiac disease but no history of premature cardiac death.  He is currently on Clomid for low testosterone .  He denies any history of prior PE or DVT no recent surgery or prolonged bedrest.  Remote history of thyroid  disease but currently not on any medication.  He denies any recent caffeine use or increased energy drinks.  He denies increasing stress.  Denies alcohol or tobacco use.  Prior to Admission medications   Medication Sig Start Date End Date Taking? Authorizing Provider  albuterol  (VENTOLIN  HFA) 108 (90 Base) MCG/ACT inhaler TAKE 2 PUFFS BY MOUTH EVERY 6 HOURS AS NEEDED FOR WHEEZE OR SHORTNESS OF BREATH 10/06/23   Henson, Vickie L, NP-C  amoxicillin -clavulanate (AUGMENTIN ) 875-125 MG tablet Take 1 tablet by mouth 2 (two) times daily. 09/09/23   Henson, Vickie L, NP-C  cetirizine (ZYRTEC) 10 MG tablet Take 10 mg by mouth daily as needed for allergies.    [provider]  EPINEPHrine  0.3 mg/0.3 mL IJ SOAJ injection Inject into outer thigh 07/28/23   Henson,  Vickie L, NP-C  predniSONE  (DELTASONE ) 20 MG tablet Take 2 tablets (40 mg total) by mouth daily with breakfast. 09/09/23   Henson, Vickie L, NP-C    Allergies: Sildenafil     Review of Systems  Cardiovascular:  Positive for chest pain.  All other systems reviewed and are negative.   Updated Vital Signs BP (!) 153/96 (BP Location: Right Arm)   Pulse 93   Temp 98.6 F (37 C)   Resp 20   Ht 5' 7 (1.702 m)   Wt 110 kg   SpO2 100%   BMI 37.98 kg/m   Physical Exam Constitutional:      General: He is not in acute distress.    Appearance: He is well-developed.  HENT:     Head: Atraumatic.  Eyes:     Conjunctiva/sclera: Conjunctivae normal.  Neck:     Vascular: No carotid bruit.     Comments: No thyromegaly Cardiovascular:     Rate and Rhythm: Normal rate and regular rhythm.     Pulses: Normal pulses.     Heart sounds: Normal heart sounds.  Pulmonary:     Effort: Pulmonary effort is normal.     Breath sounds: Normal breath sounds.  Musculoskeletal:     Cervical back: Normal range of motion and neck supple. No rigidity or tenderness.     Right lower leg: No edema.     Left lower leg: No edema.  Lymphadenopathy:  Cervical: No cervical adenopathy.  Skin:    Findings: No rash.  Neurological:     Mental Status: He is alert.     (all labs ordered are listed, but only abnormal results are displayed) Labs Reviewed  BASIC METABOLIC PANEL WITH GFR - Abnormal; Notable for the following components:      Result Value   Glucose, Bld 107 (*)    All other components within normal limits  CBC  TSH  D-DIMER, QUANTITATIVE  TROPONIN I (HIGH SENSITIVITY)  TROPONIN I (HIGH SENSITIVITY)    EKG: EKG Interpretation Date/Time:  Wednesday May 02 2024 05:15:36 EDT Ventricular Rate:  84 PR Interval:  160 QRS Duration:  92 QT Interval:  380 QTC Calculation: 449 R Axis:   88  Text Interpretation: Normal sinus rhythm with sinus arrhythmia Cannot rule out Inferior infarct , age  undetermined Cannot rule out Anterior infarct , age undetermined No significant change since last tracing When compared with ECG of 02-May-2024 05:08, PREVIOUS ECG IS PRESENT Confirmed by Doretha Folks ([id number]) on 05/02/2024 7:39:13 AM  Radiology: [facility] Chest 2 View Result Date: 05/02/2024 CLINICAL DATA:  Chest pain for 8 hours. EXAM: CHEST - 2 VIEW COMPARISON:  PA Lat chest 09/09/2023 FINDINGS: The heart size and mediastinal contours are within normal limits. Both lungs are clear. The visualized skeletal structures are unremarkable. IMPRESSION: No active cardiopulmonary disease.  Stable chest. Electronically Signed   By: Francis Quam M.D.   On: 05/02/2024 05:40     Procedures   Medications Ordered in the ED - No data to display                                  Medical Decision Making Amount and/or Complexity of Data Reviewed Labs: ordered. Radiology: ordered.   BP (!) 153/96 (BP Location: Right Arm)   Pulse 93   Temp 98.6 F (37 C)   Resp 20   Ht 5' 7 (1.702 m)   Wt 110 kg   SpO2 100%   BMI 37.98 kg/m   17:7 AM  28 year old male history of hypothyroidism, anxiety, asthma, presenting with complaints of chest pain.  Last night while at work he noticed pain in his chest.  He described pain as a pressure sensation across his chest, felt a bit lightheadedness, some pain in his back, and tingling sensation to his hands.  His wife indicates that his heart rate was in the 130s for approximately 4 hours despite not exerting himself.  He checked his blood pressure and it was in the 130 systolic.  He went to sleep and woke up this morning with the same symptoms prompting this ER visit.  He does report family history of cardiac disease but no history of premature cardiac death.  He is currently on Clomid for low testosterone .  He denies any history of prior PE or DVT no recent surgery or prolonged bedrest.  Remote history of thyroid  disease but currently not on any medication.  He denies any  recent caffeine use or increased energy drinks.  He denies increasing stress.  Denies alcohol or tobacco use.  On exam patient is resting comfortably appears to be in no acute discomfort.  Heart with normal rate and rhythm, lungs clear abdomen soft nontender no thyromegaly no peripheral edema.  -Labs ordered, independently viewed and interpreted by me.  Labs remarkable for normal delta troponin low suspicion for Mace, normal TSH, low suspicion for  thyroid  storm, negative D-dimer, low suspicion for PE.  The remainder of the labs are reassuring. -The patient was maintained on a cardiac monitor.  I personally viewed and interpreted the cardiac monitored which showed an underlying rhythm of: Normal sinus rhythm -Imaging independently viewed and interpreted by me and I agree with radiologist's interpretation.  Result remarkable for chest x-ray unremarkable -This patient presents to the ED for concern of chest pain, this involves an extensive number of treatment options, and is a complaint that carries with it a high risk of complications and morbidity.  The differential diagnosis includes anxiety, stress, ACS, PE, pleurisy, pneumonia, GERD, gastritis -Co morbidities that complicate the patient evaluation includes anxiety -Treatment includes reassurance -Reevaluation of the patient after these medicines showed that the patient improved -PCP office notes or outside notes reviewed -Escalation to admission/observation considered: patients feels much better, is comfortable with discharge, and will follow up with PCP -Prescription medication considered, patient comfortable with OTC home medication -Social Determinant of Health considered which includes stress      Final diagnoses:  Nonspecific chest pain    ED Discharge Orders          Ordered    Ambulatory referral to Cardiology       Comments: If you have not heard from the Cardiology office within the next 72 hours please call [phone].    05/02/24 0949               Nivia Colon, PA-C 05/02/24 9047    Doretha Folks, MD 05/02/24 1525

## 2024-05-07 ENCOUNTER — Ambulatory Visit (HOSPITAL_BASED_OUTPATIENT_CLINIC_OR_DEPARTMENT_OTHER): Payer: Self-pay | Admitting: Family

## 2024-05-07 ENCOUNTER — Ambulatory Visit: Attending: Family

## 2024-05-07 ENCOUNTER — Encounter (HOSPITAL_BASED_OUTPATIENT_CLINIC_OR_DEPARTMENT_OTHER): Payer: Self-pay | Admitting: Family

## 2024-05-07 VITALS — BP 120/82 | HR 77 | Ht 67.0 in | Wt 249.0 lb

## 2024-05-07 DIAGNOSIS — R002 Palpitations: Secondary | ICD-10-CM

## 2024-05-07 DIAGNOSIS — R079 Chest pain, unspecified: Secondary | ICD-10-CM | POA: Diagnosis not present

## 2024-05-07 DIAGNOSIS — Z8249 Family history of ischemic heart disease and other diseases of the circulatory system: Secondary | ICD-10-CM | POA: Diagnosis not present

## 2024-05-07 NOTE — Progress Notes (Unsigned)
 Enrolled patient for a 14 day Zio XT monitor to be mailed to patients home   DOD to read-old JV patient

## 2024-05-07 NOTE — Progress Notes (Signed)
 Cardiology Office Note   Date:  05/07/2024  ID:  Philip Stephenson, DOB 1996-05-06, MRN [id number] PCP: Lendia Boby CROME, NP-C  [facility] Providers Cardiologist:  None Cardiology APP:  Vannie Reche RAMAN, NP     History of Present Illness  Discussed the use of AI scribe software for clinical note transcription with the patient, who gave verbal consent to proceed.  History of Present Illness Philip Stephenson is a 28 year old male with asthma, hypothyroidism, anxiety who presents with chest pain and shortness of breath.  He experiences mid-chest pressure that began last Tuesday, worsening at night and with position changes. During an episode at work, he had excessive sweating and a heart rate of 136 bpm. The chest pain progressed to include wheezing after coughing. No recent infection or illness. No caffeine intake on the day of the episode.  In September, he had a similar episode of chest pressure and pain after an anaphylactic reaction to sildenafil . Symptoms were similar but less severe than the recent episode.  He has a history of elevated heart rates, with a previous episode in 2017 prompting an ER visit. In 2023, exposure to atropine sulfate caused numbness and chest symptoms, leading to cardiac evaluation.  Current medications include an albuterol  inhaler, Zyrtec as needed, Clomid, and an EpiPen . He does not use a daily inhaler.  He monitors his blood pressure at home, noting it was 138/86 after the recent episode, with a heart rate above 100 bpm for several hours.  Family history includes his father having atrial fibrillation, congestive heart failure, diabetes, and heart attacks, with two undiagnosed prior to his death at age 58.    ROS: Please see the history of present illness.    All other systems reviewed and are negative.   Studies Reviewed      Cardiac Studies & Procedures    ______________________________________________________________________________________________     ECHOCARDIOGRAM  ECHOCARDIOGRAM COMPLETE 06/17/2022  Narrative ECHOCARDIOGRAM REPORT    Patient Name:   Philip Stephenson Date of Exam: 06/17/2022 Medical Rec #:  [id number]        Height:       67.0 in Accession #:    [id number]       Weight:       245.0 lb Date of Birth:  [birth year]        BSA:          2.204 m Patient Age:    26 years         BP:           131/74 mmHg Patient Gender: M                HR:           74 bpm. Exam Location:  [address]  Procedure: 2D Echo, 3D Echo, Cardiac Doppler and Color Doppler  Indications:    R06.09 Dyspnea  History:        Patient has no prior history of Echocardiogram examinations. No cardiac history.  Sonographer:    Waldo Guadalajara RCS Referring Phys: 84 JAYADEEP S VARANASI  IMPRESSIONS   1. Left ventricular ejection fraction, by estimation, is 60 to 65%. Left ventricular ejection fraction by 3D volume is 62 %. The left ventricle has normal function. The left ventricle has no regional wall motion abnormalities. Left ventricular diastolic parameters were normal. 2. Right ventricular systolic function is normal. The right ventricular size is normal. Tricuspid regurgitation signal is inadequate for assessing PA pressure. 3. The mitral  valve is normal in structure. No evidence of mitral valve regurgitation. No evidence of mitral stenosis. 4. The aortic valve was not well visualized. Aortic valve regurgitation is not visualized. No aortic stenosis is present. 5. The inferior vena cava is dilated in size with >50% respiratory variability, suggesting right atrial pressure of 8 mmHg.  FINDINGS Left Ventricle: Left ventricular ejection fraction, by estimation, is 60 to 65%. Left ventricular ejection fraction by 3D volume is 62 %. The left ventricle has normal function. The left ventricle has no regional wall motion abnormalities. The  left ventricular internal cavity size was normal in size. There is no left ventricular hypertrophy. Left ventricular diastolic parameters were normal.  Right Ventricle: The right ventricular size is normal. No increase in right ventricular wall thickness. Right ventricular systolic function is normal. Tricuspid regurgitation signal is inadequate for assessing PA pressure.  Left Atrium: Left atrial size was normal in size.  Right Atrium: Right atrial size was normal in size.  Pericardium: There is no evidence of pericardial effusion.  Mitral Valve: The mitral valve is normal in structure. No evidence of mitral valve regurgitation. No evidence of mitral valve stenosis.  Tricuspid Valve: The tricuspid valve is normal in structure. Tricuspid valve regurgitation is not demonstrated.  Aortic Valve: The aortic valve was not well visualized. Aortic valve regurgitation is not visualized. No aortic stenosis is present. Aortic valve mean gradient measures 3.0 mmHg. Aortic valve peak gradient measures 5.3 mmHg.  Pulmonic Valve: The pulmonic valve was not well visualized. Pulmonic valve regurgitation is not visualized.  Aorta: The aortic root and ascending aorta are structurally normal, with no evidence of dilitation.  Venous: The inferior vena cava is dilated in size with greater than 50% respiratory variability, suggesting right atrial pressure of 8 mmHg.  IAS/Shunts: The interatrial septum was not well visualized.   LEFT VENTRICLE PLAX 2D LVIDd:         5.20 cm         Diastology LVIDs:         3.60 cm         LV e' medial:   12.50 cm/s LV PW:         0.90 cm         LV E/e' medial: 7.5 LV IVS:        1.00 cm LVOT diam:     2.30 cm LVOT Area:     4.15 cm        3D Volume EF LV 3D EF:    Left ventricul ar ejection fraction by 3D volume is 62 %.  3D Volume EF: 3D EF:        62 % LV EDV:       120 ml LV ESV:       45 ml LV SV:        75 ml  RIGHT VENTRICLE RV Basal diam:  3.20  cm RV S prime:     13.60 cm/s TAPSE (M-mode): 1.5 cm  LEFT ATRIUM             Index        RIGHT ATRIUM           Index LA diam:        3.60 cm 1.63 cm/m   RA Area:     15.80 cm LA Vol (A2C):   46.1 ml 20.91 ml/m  RA Volume:   41.20 ml  18.69 ml/m LA Vol (A4C):   36.1 ml 16.38 ml/m  LA Biplane Vol: 40.7 ml 18.46 ml/m AORTIC VALVE AV Vmax:      115.00 cm/s AV Vmean:     77.200 cm/s AV VTI:       0.262 m AV Peak Grad: 5.3 mmHg AV Mean Grad: 3.0 mmHg  AORTA Ao Root diam: 3.50 cm Ao Asc diam:  2.90 cm  MITRAL VALVE MV Area (PHT):             SHUNTS MV Decel Time:             Systemic Diam: 2.30 cm MV E velocity: 93.80 cm/s MV A velocity: 60.30 cm/s MV E/A ratio:  1.56  Lonni Nanas MD Electronically signed by Lonni Nanas MD Signature Date/Time: 06/17/2022/9:20:39 PM    Final          ______________________________________________________________________________________________       Risk Assessment/Calculations           Physical Exam VS:  BP 120/82   Pulse 77   Ht 5' 7 (1.702 m)   Wt 249 lb (112.9 kg)   SpO2 99%   BMI 39.00 kg/m        Wt Readings from Last 3 Encounters:  05/07/24 249 lb (112.9 kg)  05/02/24 242 lb 8.1 oz (110 kg)  09/09/23 244 lb 6.4 oz (110.9 kg)    GEN: Well nourished, well developed in no acute distress NECK: No JVD; No carotid bruits CARDIAC: RRR, no murmurs, rubs, gallops RESPIRATORY:  Clear to auscultation without rales, wheezing or rhonchi  ABDOMEN: Soft, non-tender, non-distended EXTREMITIES:  No edema; No deformity   ASSESSMENT AND PLAN Assessment & Plan Chest pain Intermittent atypical chest pain with dyspnea, wheezing, and tachycardia. Differential includes coronary artery disease, pericarditis, and non-cardiac causes. Of note, most recent chest pain improved with sittin gup. Normal echocardiogram in 2023. Sinus arrhythmia on prior EKGs benign and typical for age.  - Order coronary calcium score  . - Order CRP and sed rate to rule out pericarditis - Mail 14 day monitor. - Consider non-cardiac evaluation if cardiac workup is normal.  Sinus arrhythmia Sinus arrhythmia on EKGs benign. No atrial fibrillation or concerning arrhythmias. Previous bradycardia resolved post anti-anxiety medication discontinuation.  Asthma Continue to follow with PCP.          Dispo: follow up in 2 months with Reche GORMAN Finder, NP   Signed, Reche GORMAN Finder, NP

## 2024-05-07 NOTE — Patient Instructions (Addendum)
 Your prior EKGs show sinus arrhythmia which an occasional early beat that is a normal finding.   Your prior echocardiogram in 2023 showed your heart muscle and heart valves were normal.   Medication Instructions:  Your physician recommends that you continue on your current medications as directed. Please refer to the Current Medication list given to you today.  *If you need a refill on your cardiac medications before your next appointment, please call your pharmacy*  Lab Work: TODAY (go to 3rd floor): sed rate, CRP If you have labs (blood work) drawn today and your tests are completely normal, you will receive your results only by: MyChart Message (if you have MyChart) OR A paper copy in the mail If you have any lab test that is abnormal or we need to change your treatment, we will call you to review the results.  Testing/Procedures: Your provider has requested that you have a coronary calcium score performed. This is not covered by insurance and will be an out-of-pocket cost of approximately $99.   Your provider has requested that you wear a Zio heart monitor for 14 days. This will be mailed to your home with instructions on how to apply the monitor and how to return it when finished. Please allow 2 weeks after returning the heart monitor before our office calls you with the results.   Follow-Up: At [facility], you and your health needs are our priority.  As part of our continuing mission to provide you with exceptional heart care, our providers are all part of one team.  This team includes your primary Cardiologist (physician) and Advanced Practice Providers or APPs (Physician Assistants and Nurse Practitioners) who all work together to provide you with the care you need, when you need it.  Your next appointment:   2 month(s)  Provider:   Reche Finder, NP  We recommend signing up for the patient portal called MyChart.  Sign up information is provided on this After Visit  Summary.  MyChart is used to connect with patients for Virtual Visits (Telemedicine).  Patients are able to view lab/test results, encounter notes, upcoming appointments, etc.  Non-urgent messages can be sent to your provider as well.   To learn more about what you can do with MyChart, go to [email or web address].   Other Instructions ZIO XT- Long Term Monitor Instructions  Your provider has requested you wear a ZIO patch monitor for 14 days.   This is a single patch monitor. [employer] supplies one patch monitor per enrollment. Additional  stickers are not available. Please do not apply patch if you will be having a Nuclear Stress Test,  Echocardiogram, Cardiac CT, MRI, or Chest Xray during the period you would be wearing the  monitor. The patch cannot be worn during these tests. You cannot remove and re-apply the  ZIO XT patch monitor.   Your ZIO patch monitor will be mailed 3 day USPS to your address on file. It may take 3-5 days  to receive your monitor after you have been enrolled.   Once you have received your monitor, please review the enclosed instructions. Your monitor  has already been registered assigning a specific monitor serial # to you.     Billing and Patient Assistance Program Information  We have supplied [employer] with any of your insurance information on file for billing purposes.  [employer] offers a sliding scale Patient Assistance Program for patients that do not have  insurance, or whose insurance does not completely cover the cost  of the ZIO monitor.  You must apply for the Patient Assistance Program to qualify for this discounted rate.   To apply, please call [employer] at [phone], select option 4, select option 2, ask to apply for  Patient Assistance Program. Meredeth will ask your household income, and how many people  are in your household. They will quote your out-of-pocket cost based on that information.  [employer] will also be able to set up a 66-month,  interest-free payment plan if needed.     Applying the monitor  Shave hair from upper left chest.  Hold abrader disc by orange tab. Rub abrader in 40 strokes over the upper left chest as  indicated in your monitor instructions.  Clean area with 4 enclosed alcohol pads. Let dry.  Apply patch as indicated in monitor instructions. Patch will be placed under collarbone on left  side of chest with arrow pointing upward.  Rub patch adhesive wings for 2 minutes. Remove white label marked 1. Remove the white  label marked 2. Rub patch adhesive wings for 2 additional minutes.  While looking in a mirror, press and release button in center of patch. A small green light will  flash 3-4 times. This will be your only indicator that the monitor has been turned on.  Do not shower for the first 24 hours. You may shower after the first 24 hours.  Press the button if you feel a symptom. You will hear a small click. Record Date, Time and  Symptom in the Patient Logbook.  When you are ready to remove the patch, follow instructions on the last 2 pages of Patient  Logbook. Stick patch monitor onto the last page of Patient Logbook.   Place Patient Logbook in the blue and white box. Use locking tab on box and tape box closed  securely. The blue and white box has prepaid postage on it. Please place it in the mailbox as  soon as possible. Your physician should have your test results approximately 7 days after the  monitor has been mailed back to [facility]   Call [facility] Customer Care at [phone] if you have questions regarding  your ZIO XT patch monitor. Call them immediately if you see an orange light blinking on your  monitor.   If your monitor falls off in less than 4 days, contact our Monitor department at [phone].   If your monitor becomes loose or falls off after 4 days call [employer] at [phone] for  suggestions on securing your monitor.

## 2024-05-08 ENCOUNTER — Ambulatory Visit (HOSPITAL_BASED_OUTPATIENT_CLINIC_OR_DEPARTMENT_OTHER): Payer: Self-pay | Admitting: Family

## 2024-05-08 LAB — C-REACTIVE PROTEIN: CRP: 4 mg/L (ref 0–10)

## 2024-05-08 LAB — SEDIMENTATION RATE: Sed Rate: 6 mm/h (ref 0–15)

## 2024-05-22 ENCOUNTER — Ambulatory Visit (HOSPITAL_BASED_OUTPATIENT_CLINIC_OR_DEPARTMENT_OTHER)
Admission: RE | Admit: 2024-05-22 | Discharge: 2024-05-22 | Disposition: A | Discharge status: Home / Self Care | Payer: Self-pay | Source: Ambulatory Visit | Attending: Family | Admitting: Family

## 2024-05-22 DIAGNOSIS — Z8249 Family history of ischemic heart disease and other diseases of the circulatory system: Secondary | ICD-10-CM | POA: Insufficient documentation

## 2024-05-22 DIAGNOSIS — R079 Chest pain, unspecified: Secondary | ICD-10-CM | POA: Insufficient documentation

## 2024-06-13 DIAGNOSIS — R002 Palpitations: Secondary | ICD-10-CM | POA: Diagnosis not present

## 2024-08-03 ENCOUNTER — Ambulatory Visit (HOSPITAL_BASED_OUTPATIENT_CLINIC_OR_DEPARTMENT_OTHER): Admitting: Family

## 2024-09-25 ENCOUNTER — Ambulatory Visit
Admission: RE | Admit: 2024-09-25 | Discharge: 2024-09-25 | Disposition: A | Discharge status: Home / Self Care | Source: Ambulatory Visit | Attending: Family Medicine | Admitting: Family Medicine

## 2024-09-25 DIAGNOSIS — J4530 Mild persistent asthma, uncomplicated: Secondary | ICD-10-CM

## 2024-09-25 DIAGNOSIS — J209 Acute bronchitis, unspecified: Secondary | ICD-10-CM

## 2024-09-25 MED ORDER — AZITHROMYCIN 250 MG PO TABS: ORAL_TABLET | ORAL | 0 refills | Status: AC

## 2024-09-25 MED ORDER — ALBUTEROL SULFATE HFA 108 (90 BASE) MCG/ACT IN AERS: 2.0000 | INHALATION_SPRAY | RESPIRATORY_TRACT | 0 refills | Status: AC | PRN

## 2024-09-25 MED ORDER — PREDNISONE 20 MG PO TABS: ORAL_TABLET | ORAL | 0 refills | Status: AC

## 2024-09-25 MED ORDER — PROMETHAZINE-DM 6.25-15 MG/5ML PO SYRP: 5.0000 mL | ORAL_SOLUTION | Freq: Three times a day (TID) | ORAL | 0 refills | Status: AC | PRN

## 2024-09-25 NOTE — ED Provider Notes (Signed)
 [facility]  Note:  This document was prepared using Conservation officer, historic buildings and may include unintentional dictation errors.  MRN: [id number] DOB: [birth year]  Subjective:   Denim Kalmbach is a 28 y.o. male presenting for 3-week history of acute onset persistent worsening coughing, bronchospasm, chest pain.  Patient has a history of asthma.  Has had intermittent malaise, fatigue, chills, subjective fever.  No hemoptysis, wheezing.  No smoking of any kind including cigarettes, cigars, vaping, marijuana use.    No current facility-administered medications for this encounter.  Current Outpatient Medications:    albuterol  (VENTOLIN  HFA) 108 (90 Base) MCG/ACT inhaler, TAKE 2 PUFFS BY MOUTH EVERY 6 HOURS AS NEEDED FOR WHEEZE OR SHORTNESS OF BREATH, Disp: 18 g, Rfl: 2   cetirizine (ZYRTEC) 10 MG tablet, Take 10 mg by mouth daily as needed for allergies., Disp: , Rfl:    CLOMID 50 MG tablet, Take by mouth., Disp: , Rfl:    EPINEPHrine  0.3 mg/0.3 mL IJ SOAJ injection, Inject into outer thigh, Disp: 1 each, Rfl: 2   Allergies  Allergen Reactions   Sildenafil  Shortness Of Breath, Swelling and Other (See Comments)    Drowsiness and Chest Pain    Past Medical History:  Diagnosis Date   Allergy    Anxiety    Asthma      Past Surgical History:  Procedure Laterality Date   APPENDECTOMY     HERNIA REPAIR      Family History  Problem Relation Age of Onset   Basal cell carcinoma Mother    Depression Mother    Cancer Mother        basal cell carcinoma nose   Diabetes Father    Thyroid  disease Father    Atrial fibrillation Father    Heart failure Father    Heart disease Father        coronary stents   Hypertension Father    Kidney disease Father    Depression Sister    Asthma Sister    Depression Sister    Cancer Maternal Grandfather    Heart disease Paternal Grandfather    Prostate cancer Paternal Grandfather    Diabetes Paternal Grandfather     Cancer Paternal Grandfather    Hypertension Paternal Grandfather    Arthritis Maternal Aunt    Cancer Maternal Aunt    Cancer Maternal Uncle     Social History   Tobacco Use   Smoking status: Never   Smokeless tobacco: Never  Vaping Use   Vaping status: Never Used  Substance Use Topics   Alcohol use: Yes    Comment: rarely   Drug use: No    ROS   Objective:   Vitals: BP (!) (P) 151/89 (BP Location: Right Arm)   Pulse (P) 89   Temp (P) 98.7 F (37.1 C) (Oral)   Resp (P) 20   SpO2 (P) 96%   Physical Exam Constitutional:      General: He is not in acute distress.    Appearance: Normal appearance. He is well-developed and normal weight. He is not ill-appearing, toxic-appearing or diaphoretic.  HENT:     Head: Normocephalic and atraumatic.     Right Ear: Tympanic membrane, ear canal and external ear normal. No drainage, swelling or tenderness. No middle ear effusion. There is no impacted cerumen. Tympanic membrane is not erythematous or bulging.     Left Ear: Tympanic membrane, ear canal and external ear normal. No drainage, swelling or tenderness.  No middle  ear effusion. There is no impacted cerumen. Tympanic membrane is not erythematous or bulging.     Nose: Nose normal. No congestion or rhinorrhea.     Mouth/Throat:     Mouth: Mucous membranes are moist.     Pharynx: No oropharyngeal exudate or posterior oropharyngeal erythema.  Eyes:     General: No scleral icterus.       Right eye: No discharge.        Left eye: No discharge.     Extraocular Movements: Extraocular movements intact.     Conjunctiva/sclera: Conjunctivae normal.  Cardiovascular:     Rate and Rhythm: Normal rate and regular rhythm.     Heart sounds: Normal heart sounds. No murmur heard.    No friction rub. No gallop.  Pulmonary:     Effort: Pulmonary effort is normal. No respiratory distress.     Breath sounds: No stridor. Rhonchi (trace over mid lung fields) present. No wheezing or rales.   Musculoskeletal:     Cervical back: Normal range of motion and neck supple. No rigidity. No muscular tenderness.  Skin:    General: Skin is warm and dry.  Neurological:     General: No focal deficit present.     Mental Status: He is alert and oriented to person, place, and time.  Psychiatric:        Mood and Affect: Mood normal.        Behavior: Behavior normal.        Thought Content: Thought content normal.        Judgment: Judgment normal.     Assessment and Plan :   PDMP not reviewed this encounter.  1. Acute bronchitis, unspecified organism   2. Mild persistent asthma without complication    Will defer imaging.  Recommended coverage for acute bacterial bronchitis with azithromycin  as well as prednisone  given his underlying asthma.  Refilled his albuterol  inhaler.  Use supportive care. Counseled patient on potential for adverse effects with medications prescribed/recommended today, ER and return-to-clinic precautions discussed, patient verbalized understanding.    Christopher Savannah, [place] 09/25/24 [id number]

## 2024-09-25 NOTE — Discharge Instructions (Addendum)
 I am managing you for bronchitis with azithromycin and prednisone .  Use your albuterol  inhaler as needed for shortness of breath and wheezing.

## 2024-09-25 NOTE — ED Triage Notes (Signed)
 Pt c/o intermittent cough, CP x 3 weeks-no sx OTC meds-NAD-steady gait
